# Patient Record
Sex: Female | Born: 1970 | Race: White | Hispanic: No | Marital: Married | Smoking: Never smoker
Health system: Southern US, Community
[De-identification: ages and names within clinical notes are randomized; demographics above are authoritative.]

## PROBLEM LIST (undated history)

## (undated) DIAGNOSIS — I2510 Atherosclerotic heart disease of native coronary artery without angina pectoris: Secondary | ICD-10-CM

## (undated) DIAGNOSIS — Z86718 Personal history of other venous thrombosis and embolism: Secondary | ICD-10-CM

## (undated) DIAGNOSIS — I10 Essential (primary) hypertension: Secondary | ICD-10-CM

## (undated) DIAGNOSIS — E119 Type 2 diabetes mellitus without complications: Secondary | ICD-10-CM

## (undated) HISTORY — DX: Atherosclerotic heart disease of native coronary artery without angina pectoris: I25.10

## (undated) HISTORY — DX: Personal history of other venous thrombosis and embolism: Z86.718

## (undated) HISTORY — DX: Essential (primary) hypertension: I10

## (undated) HISTORY — PX: CHOLECYSTECTOMY: SHX55

## (undated) HISTORY — DX: Type 2 diabetes mellitus without complications: E11.9

## (undated) HISTORY — PX: UMBILICAL HERNIA REPAIR: SHX196

---

## 2006-11-22 ENCOUNTER — Ambulatory Visit (HOSPITAL_COMMUNITY): Admission: RE | Admit: 2006-11-22 | Discharge: 2006-11-22 | Payer: Self-pay | Admitting: Obstetrics and Gynecology

## 2006-11-29 ENCOUNTER — Encounter: Admission: RE | Admit: 2006-11-29 | Discharge: 2006-11-29 | Payer: Self-pay | Admitting: Obstetrics and Gynecology

## 2006-12-17 ENCOUNTER — Ambulatory Visit (HOSPITAL_COMMUNITY): Admission: RE | Admit: 2006-12-17 | Discharge: 2006-12-17 | Payer: Self-pay | Admitting: Obstetrics and Gynecology

## 2007-01-14 ENCOUNTER — Ambulatory Visit (HOSPITAL_COMMUNITY): Admission: RE | Admit: 2007-01-14 | Discharge: 2007-01-14 | Payer: Self-pay | Admitting: Obstetrics and Gynecology

## 2007-01-28 ENCOUNTER — Ambulatory Visit (HOSPITAL_COMMUNITY): Admission: RE | Admit: 2007-01-28 | Discharge: 2007-01-28 | Payer: Self-pay | Admitting: Obstetrics and Gynecology

## 2007-02-23 ENCOUNTER — Ambulatory Visit (HOSPITAL_COMMUNITY): Admission: RE | Admit: 2007-02-23 | Discharge: 2007-02-23 | Payer: Self-pay | Admitting: Obstetrics and Gynecology

## 2007-03-18 ENCOUNTER — Ambulatory Visit (HOSPITAL_COMMUNITY): Admission: RE | Admit: 2007-03-18 | Discharge: 2007-03-18 | Payer: Self-pay | Admitting: Obstetrics and Gynecology

## 2007-04-08 ENCOUNTER — Ambulatory Visit (HOSPITAL_COMMUNITY): Admission: RE | Admit: 2007-04-08 | Discharge: 2007-04-08 | Payer: Self-pay | Admitting: Obstetrics and Gynecology

## 2007-04-29 ENCOUNTER — Ambulatory Visit (HOSPITAL_COMMUNITY): Admission: RE | Admit: 2007-04-29 | Discharge: 2007-04-29 | Payer: Self-pay | Admitting: Obstetrics and Gynecology

## 2007-05-23 ENCOUNTER — Ambulatory Visit (HOSPITAL_COMMUNITY): Admission: RE | Admit: 2007-05-23 | Discharge: 2007-05-23 | Payer: Self-pay | Admitting: Obstetrics and Gynecology

## 2009-05-19 IMAGING — US US OB NUCHAL TRANSLUCENCY 1ST GEST
1 series · 14 of 28 positions shown · non-contrast
Comparison: none

OBSTETRICAL ULTRASOUND:
 This ultrasound was performed in The [HOSPITAL], and the [facility] report will be stored to [REDACTED] PACS.

[Series 1: us ob nuchal translucency 1st gest · 14 of 34 slices shown]
[im 2/34]
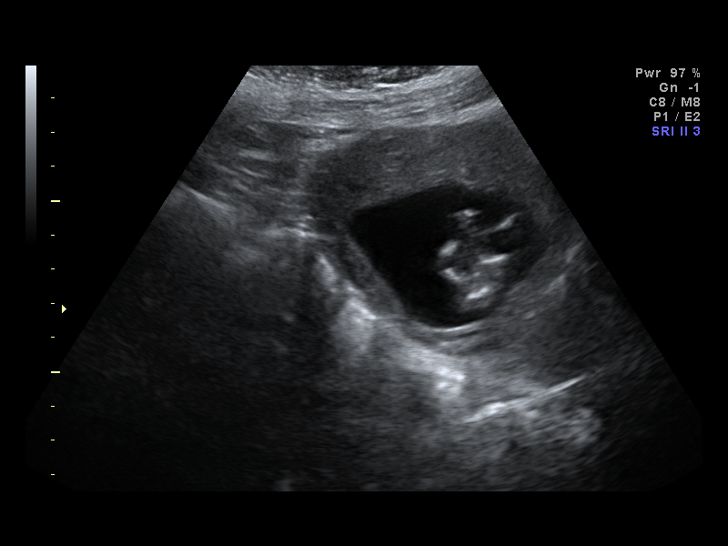
[im 4/34]
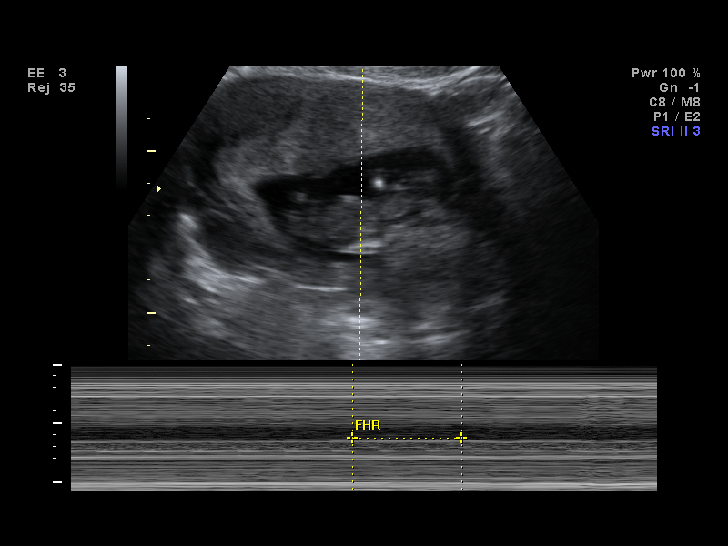
[im 7/34]
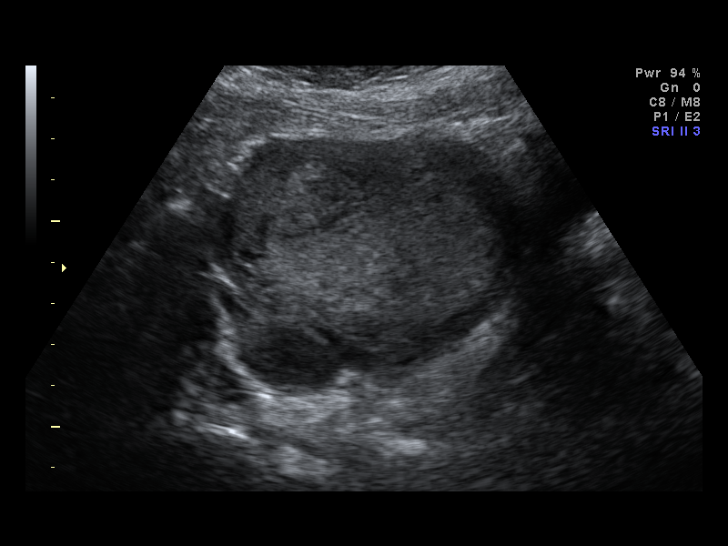
[im 9/34]
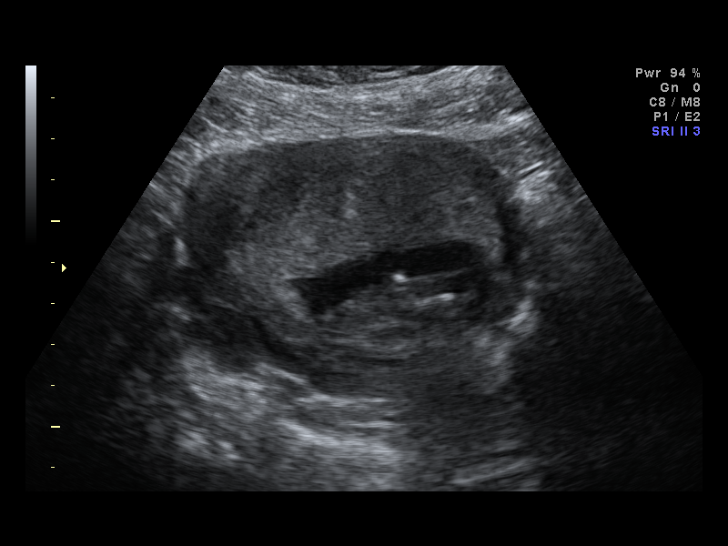
[im 12/34]
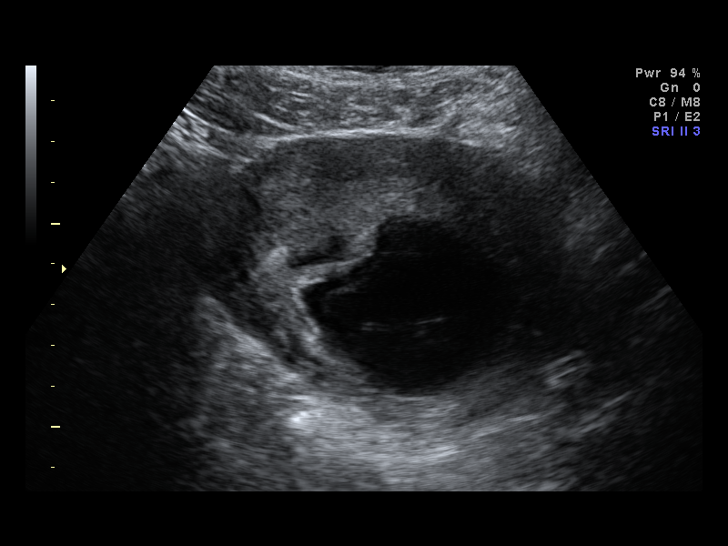
[im 14/34]
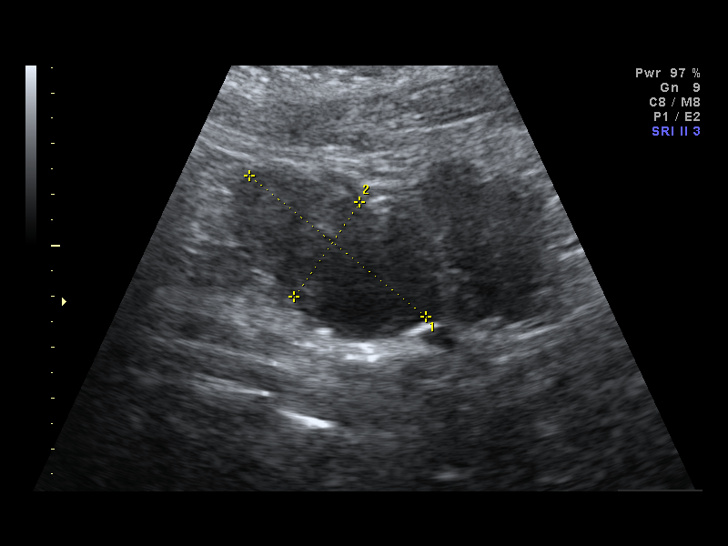
[im 16/34]
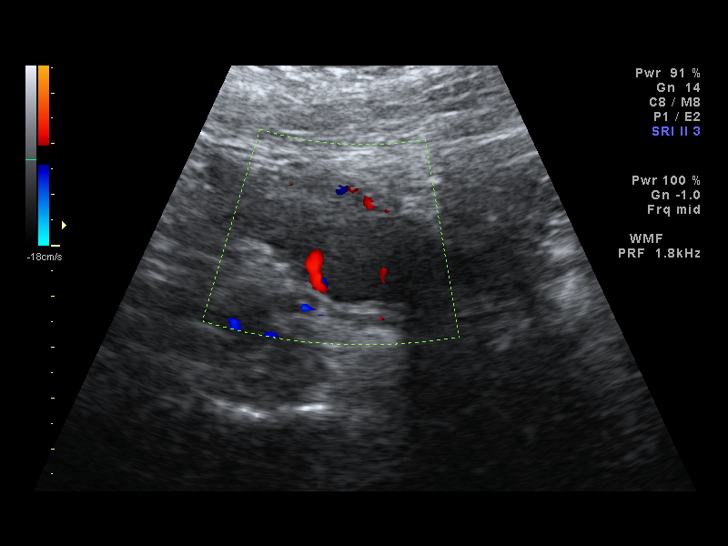
[im 19/34]
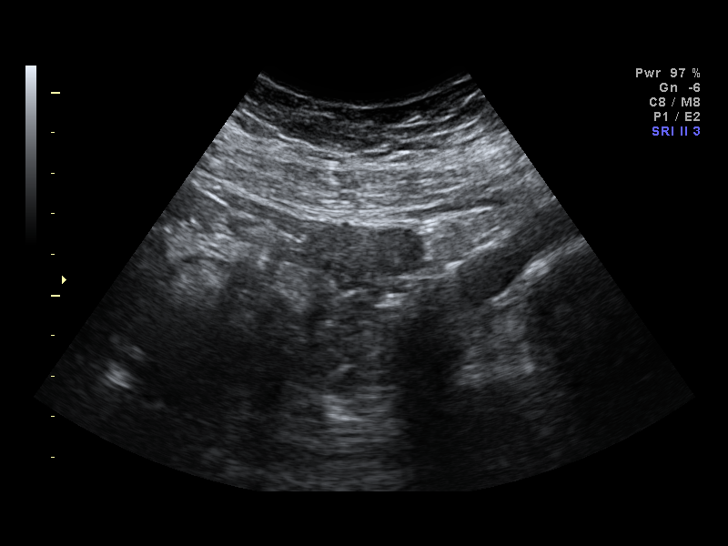
[im 21/34]
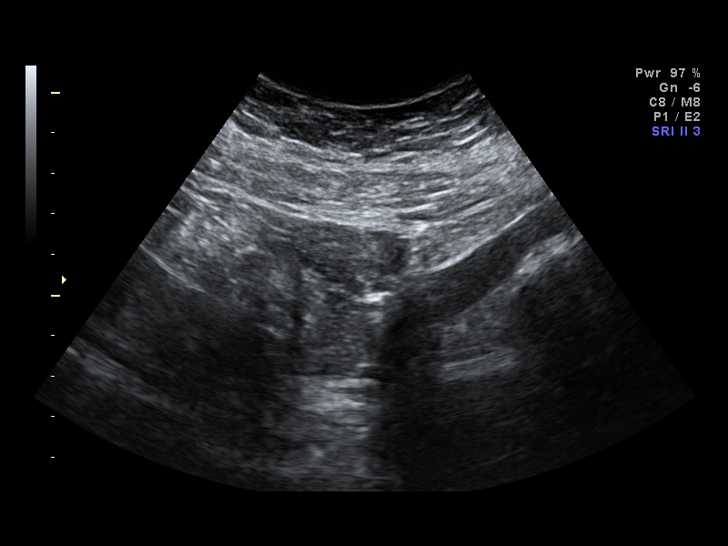
[im 24/34]
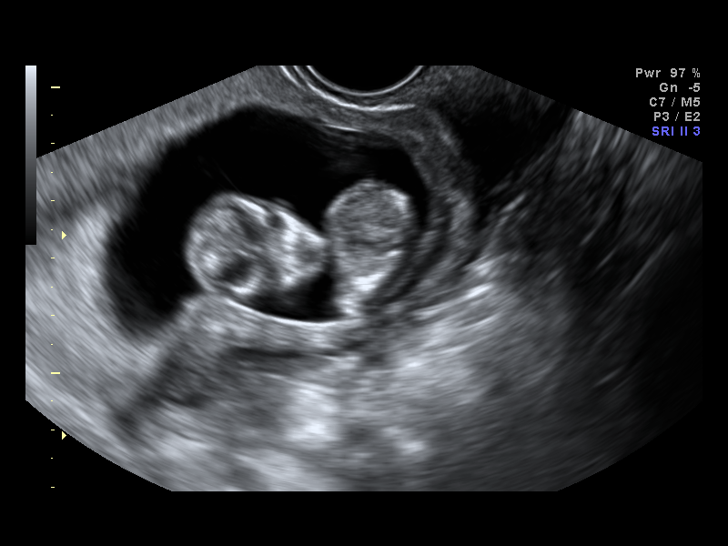
[im 26/34]
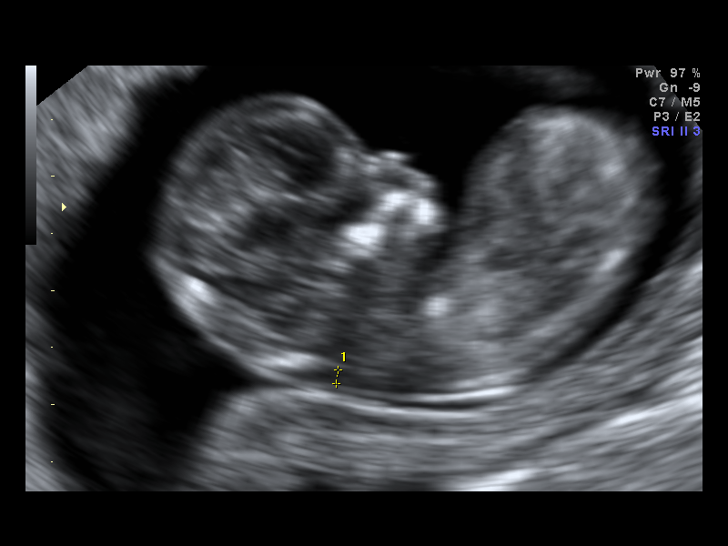
[im 29/34]
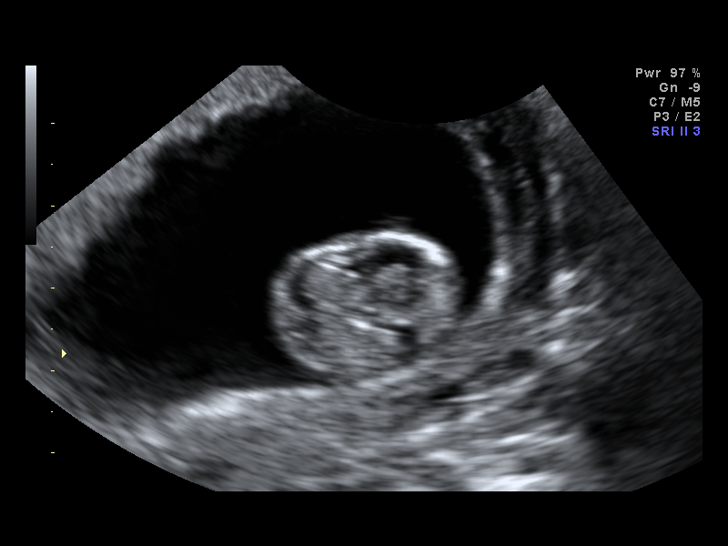
[im 31/34]
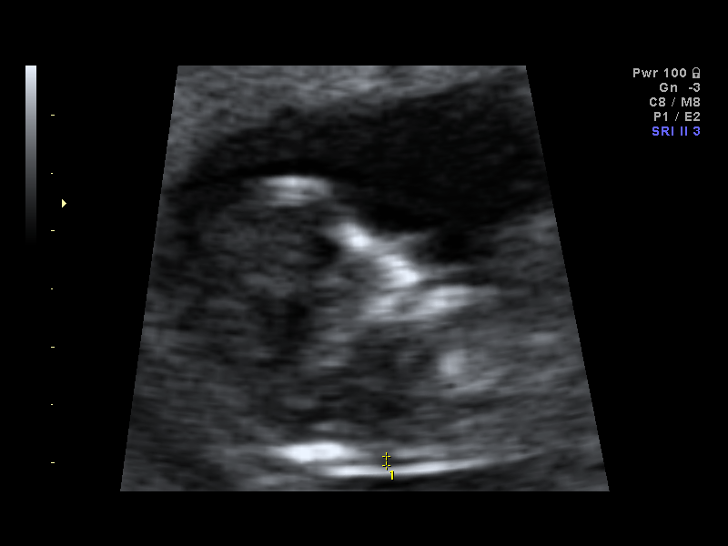
[im 34/34]
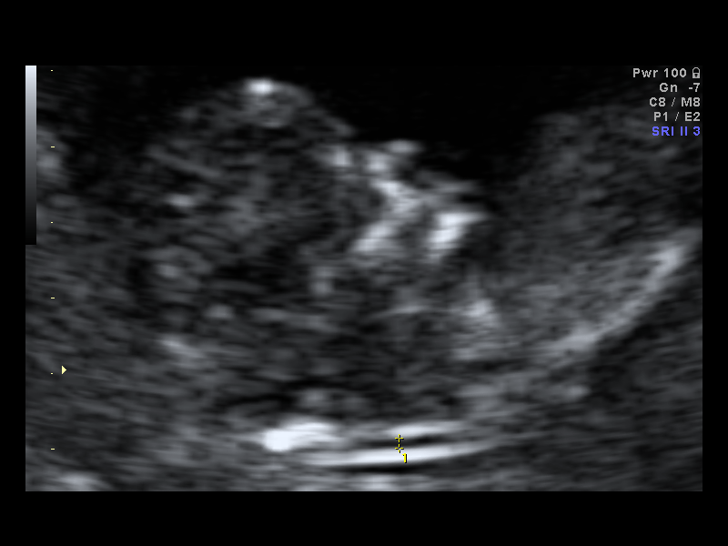

[14 of 28 positions shown; findings below may reference images not displayed]

IMPRESSION: The [facility] report has also been faxed to the ordering physician.

## 2009-05-30 ENCOUNTER — Ambulatory Visit (HOSPITAL_COMMUNITY): Admission: RE | Admit: 2009-05-30 | Discharge: 2009-05-30 | Payer: Self-pay | Admitting: Obstetrics and Gynecology

## 2009-06-27 ENCOUNTER — Ambulatory Visit (HOSPITAL_COMMUNITY): Admission: RE | Admit: 2009-06-27 | Discharge: 2009-06-27 | Payer: Self-pay | Admitting: Obstetrics and Gynecology

## 2009-07-10 ENCOUNTER — Ambulatory Visit (HOSPITAL_COMMUNITY): Admission: RE | Admit: 2009-07-10 | Discharge: 2009-07-10 | Payer: Self-pay | Admitting: Obstetrics and Gynecology

## 2009-08-18 IMAGING — US US OB FOLLOW-UP
1 series · 14 of 28 positions shown · non-contrast
Comparison: none

OBSTETRICAL ULTRASOUND:
 This ultrasound was performed in The [HOSPITAL], and the [facility] report will be stored to [REDACTED] PACS.

[Series 1: us ob follow-up · 14 of 34 slices shown]
[im 2/34]
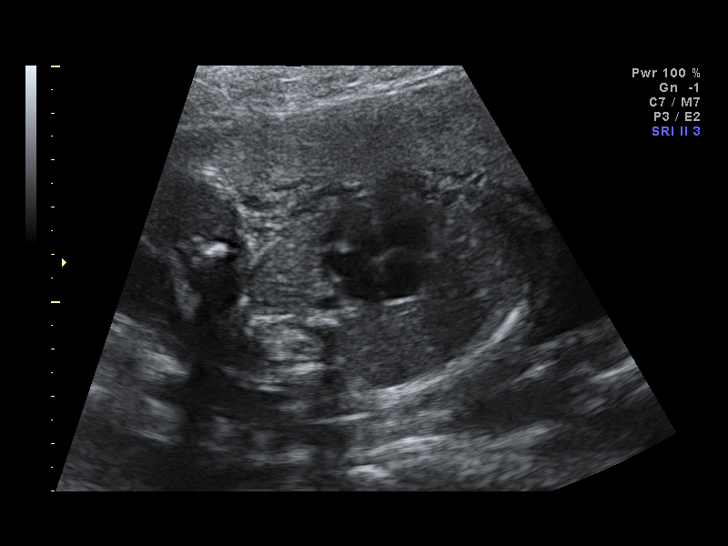
[im 4/34]
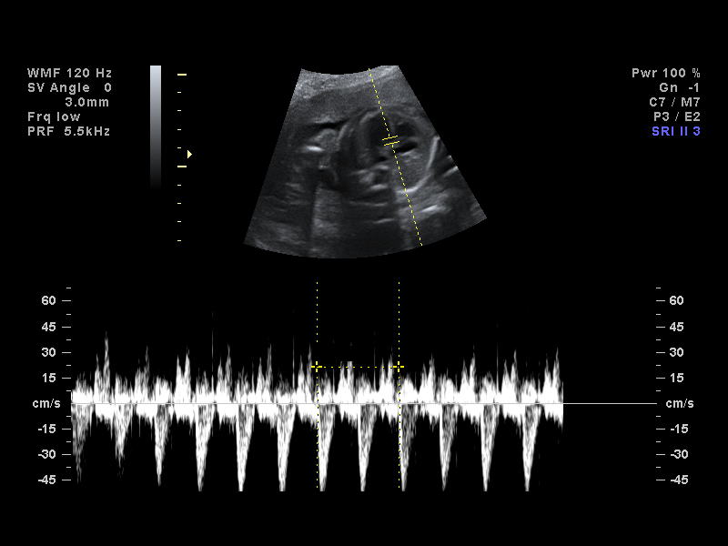
[im 7/34]
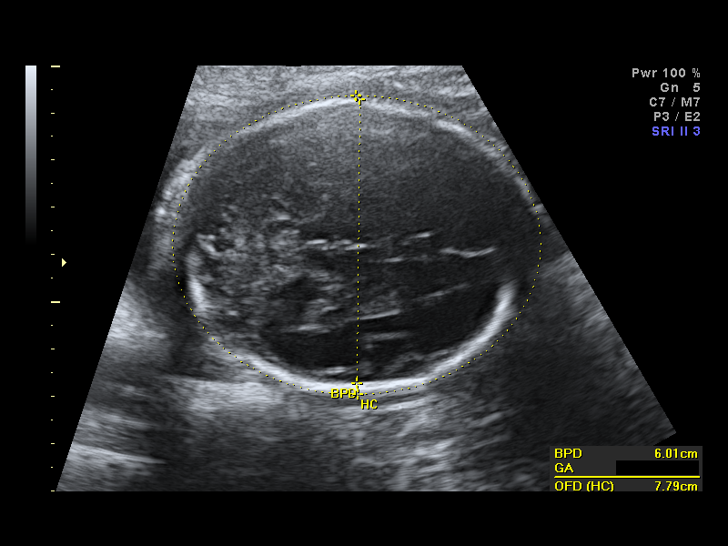
[im 9/34]
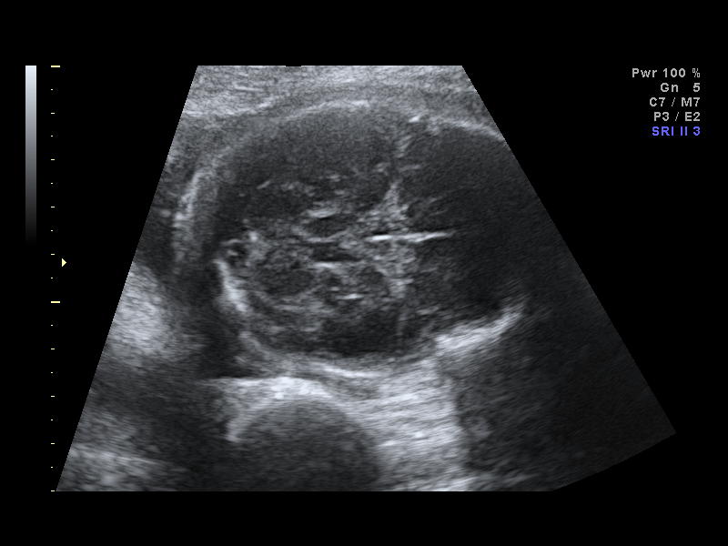
[im 12/34]
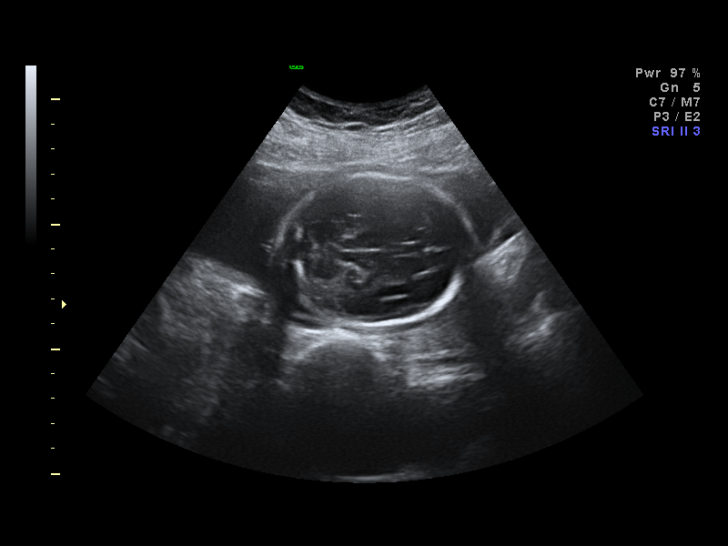
[im 14/34]
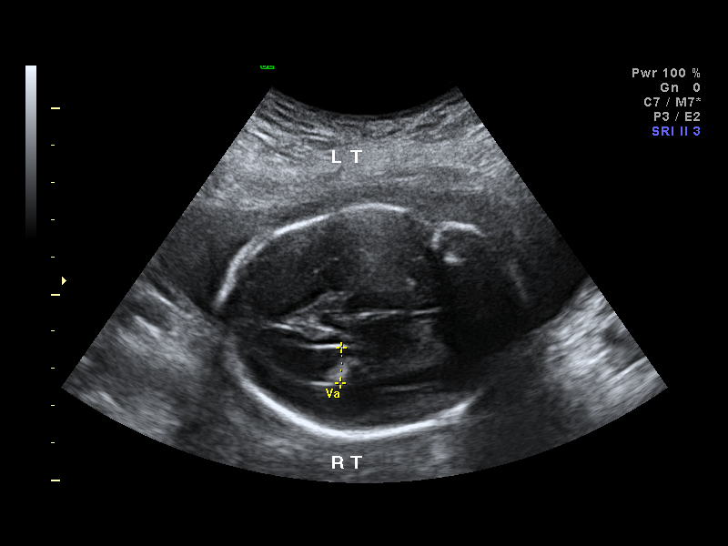
[im 16/34]
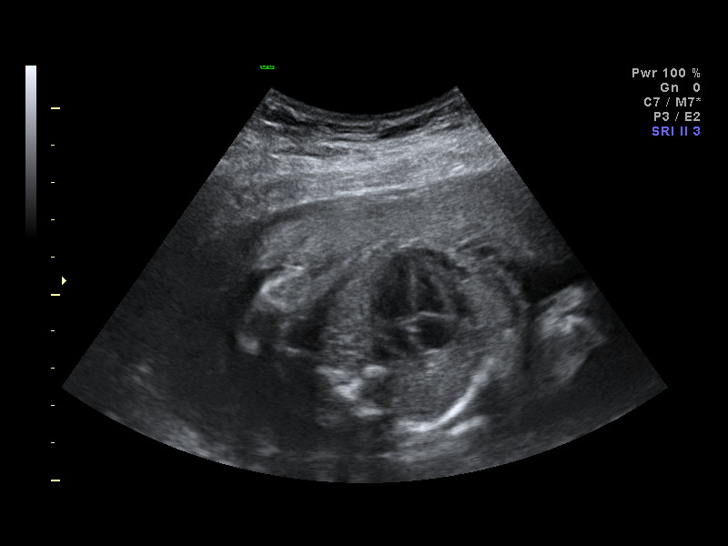
[im 19/34]
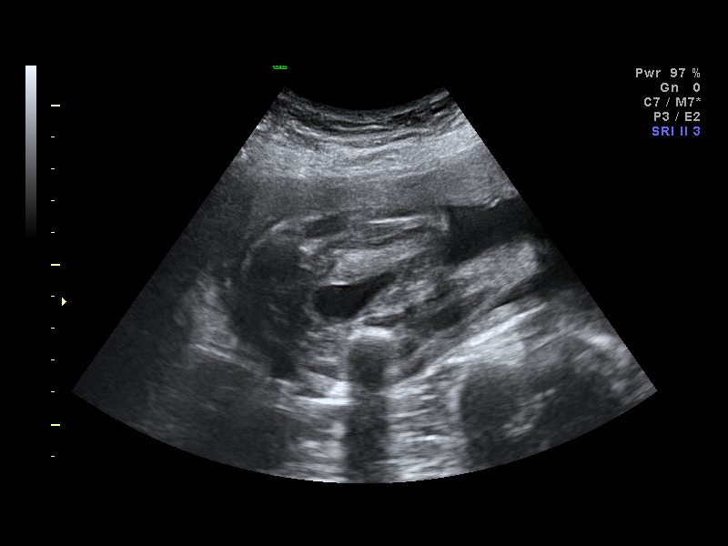
[im 21/34]
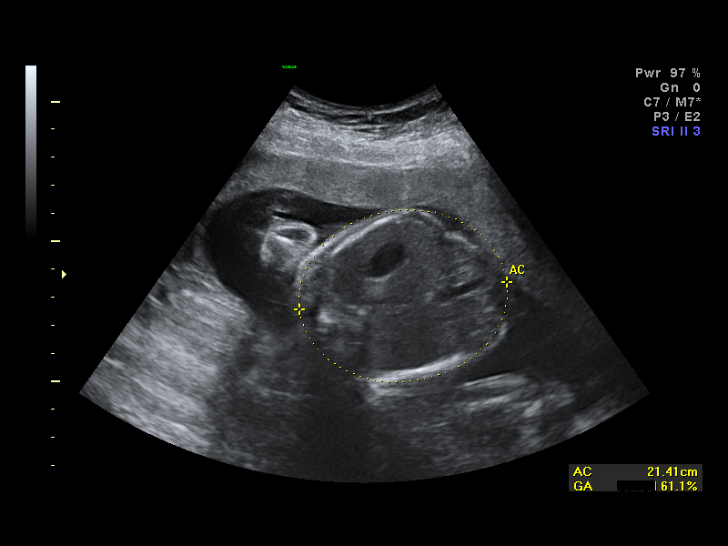
[im 24/34]
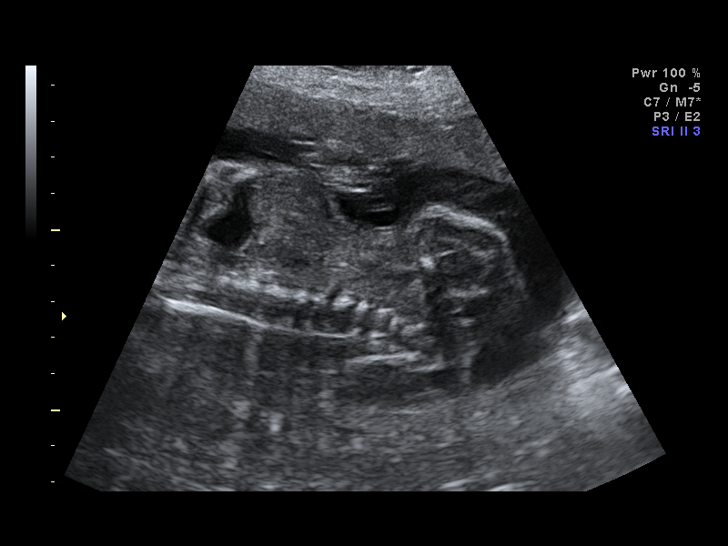
[im 26/34]
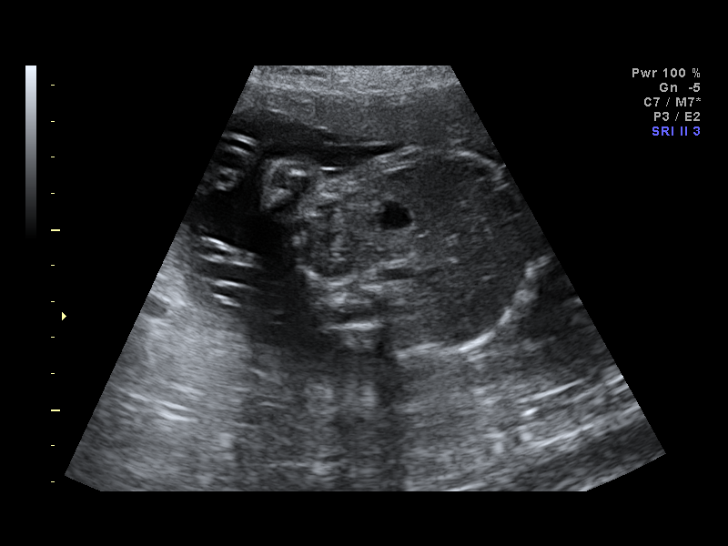
[im 29/34]
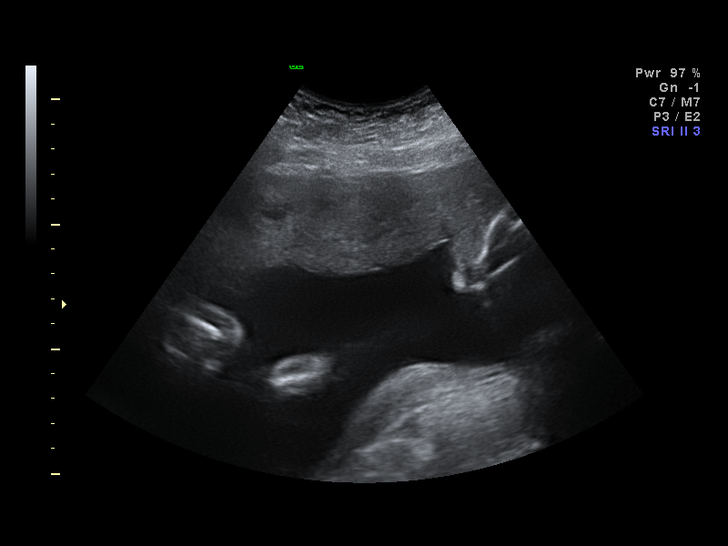
[im 31/34]
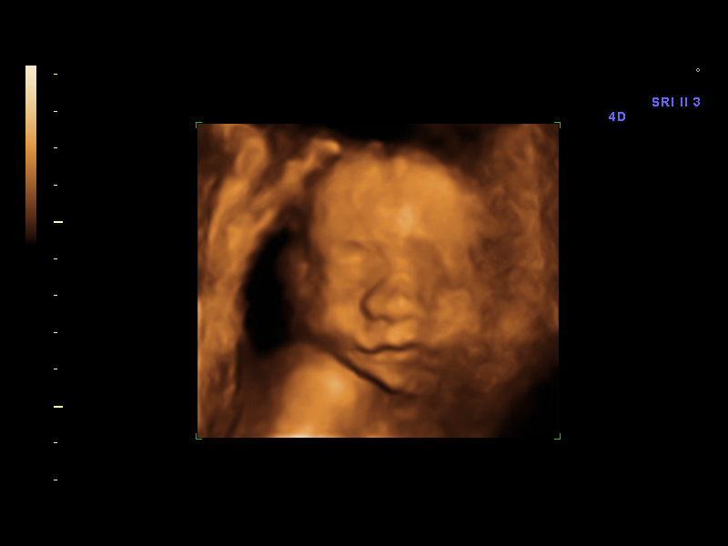
[im 34/34]
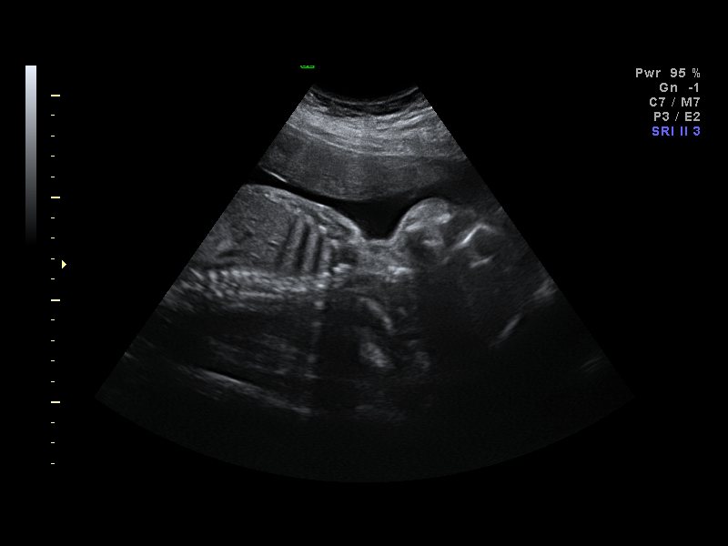

[14 of 28 positions shown; findings below may reference images not displayed]

IMPRESSION: The [facility] report has also been faxed to the ordering physician.

## 2010-06-15 ENCOUNTER — Encounter: Payer: Self-pay | Admitting: Obstetrics and Gynecology

## 2011-10-31 IMAGING — US US OB NUCHAL TRANSLUCENCY 1ST GEST
1 series · 14 of 28 positions shown · non-contrast
Comparison: none

OBSTETRICAL ULTRASOUND:
 This ultrasound was performed in The [[facility]OSP[facility]], and the [facility] report will be stored to [REDACTED] PACS.  This report is also available in [HOSPITAL]?s accessANYware.

[Series 1: us ob nuchal translucency 1st gest · 14 of 34 slices shown]
[im 2/34]
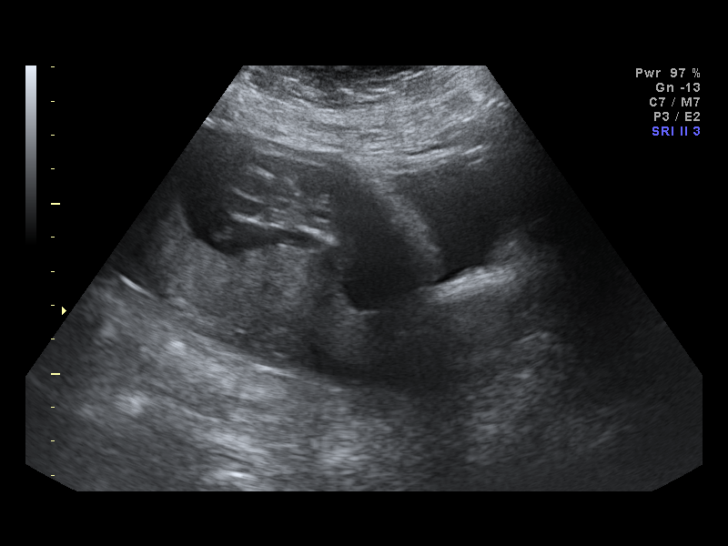
[im 4/34]
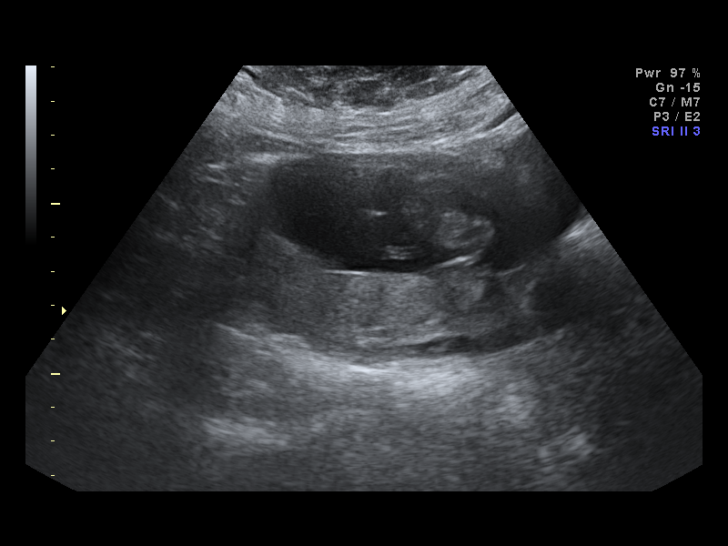
[im 7/34]
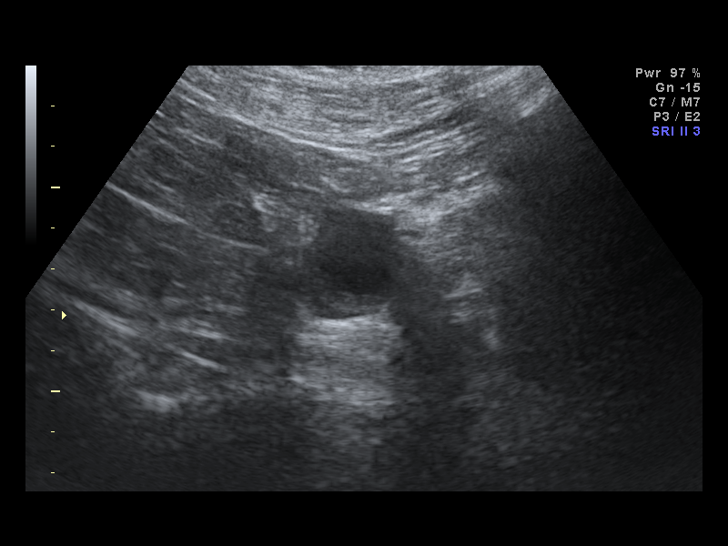
[im 9/34]
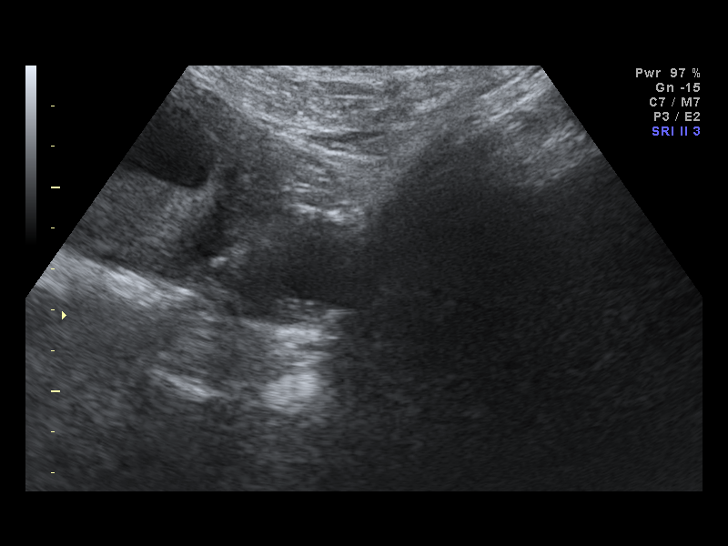
[im 12/34]
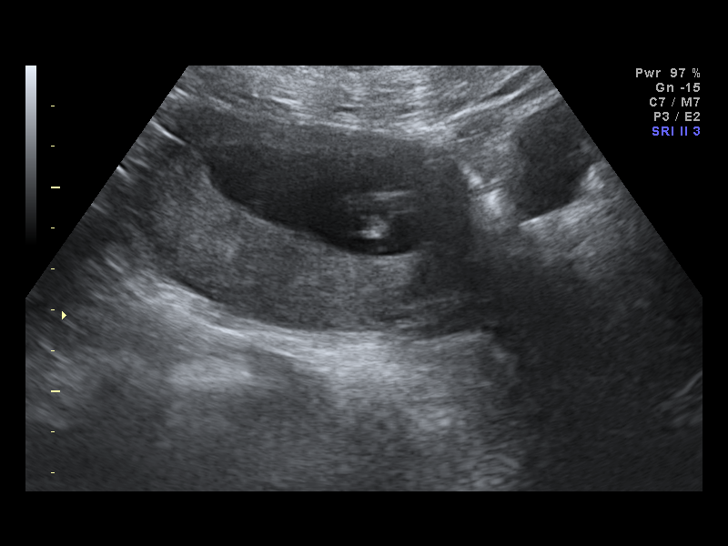
[im 14/34]
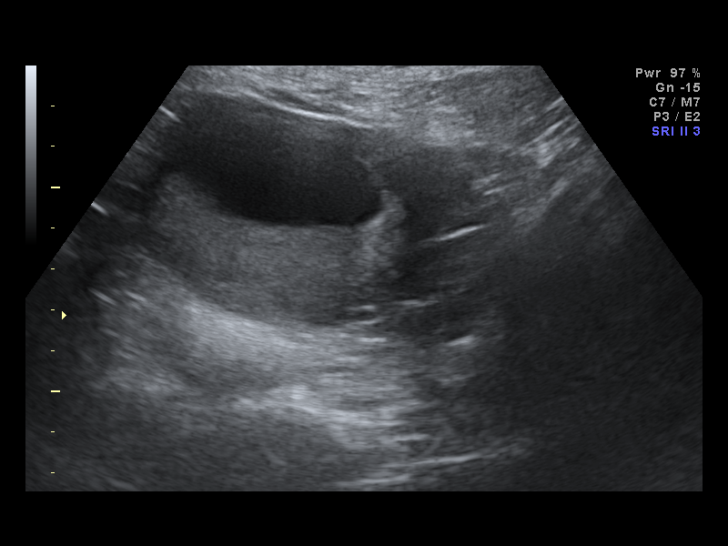
[im 16/34]
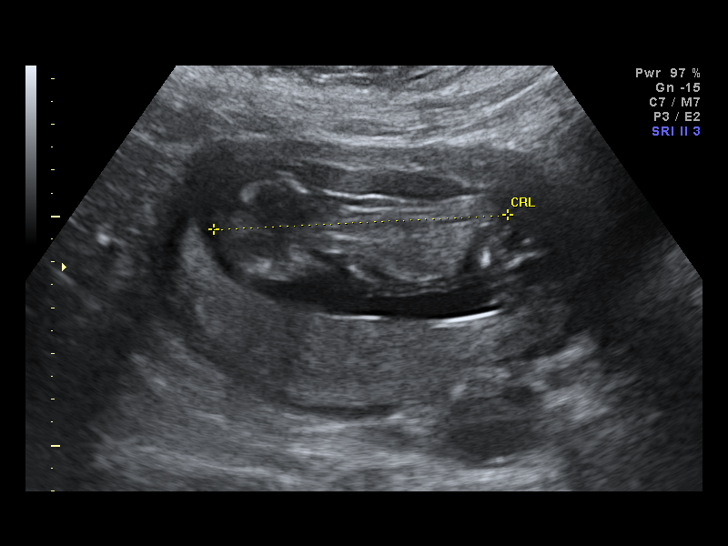
[im 19/34]
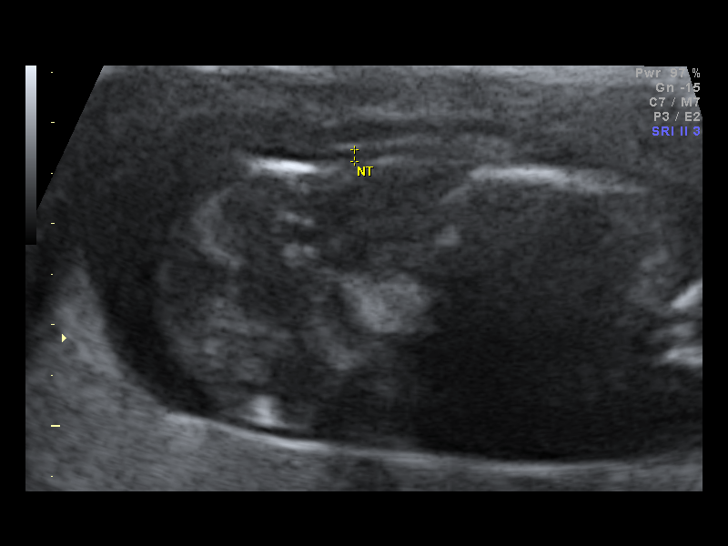
[im 21/34]
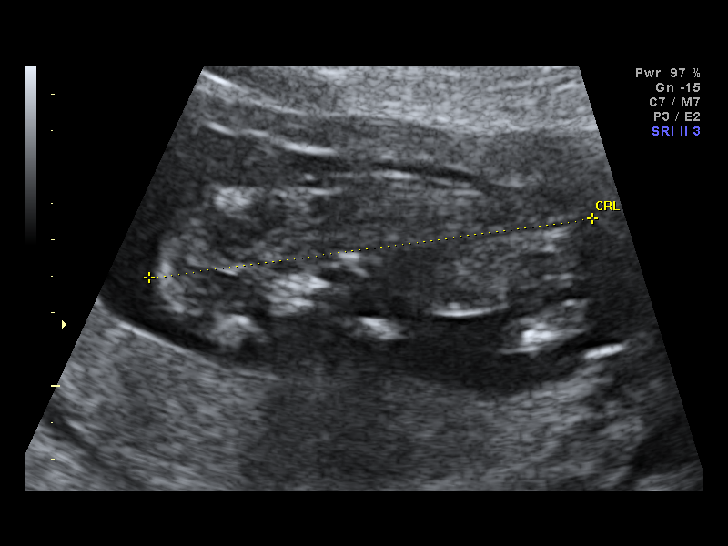
[im 24/34]
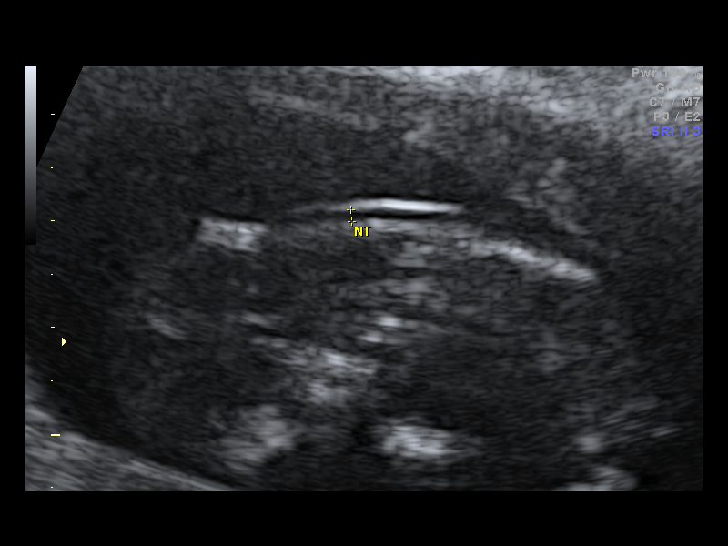
[im 26/34]
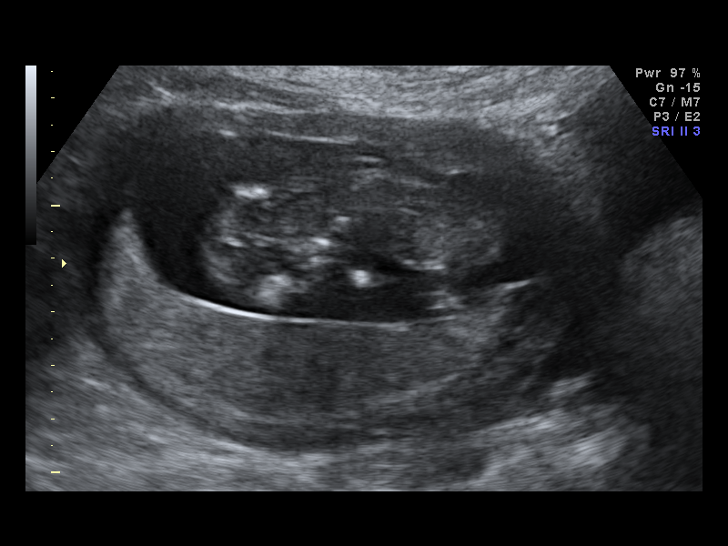
[im 29/34]
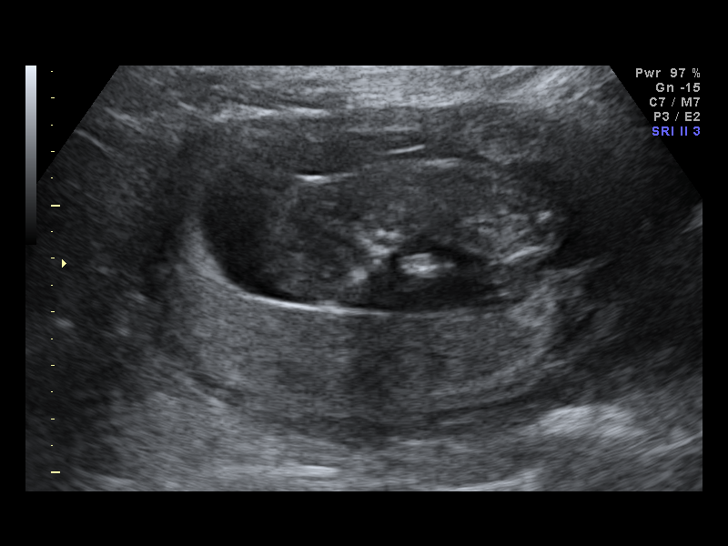
[im 31/34]
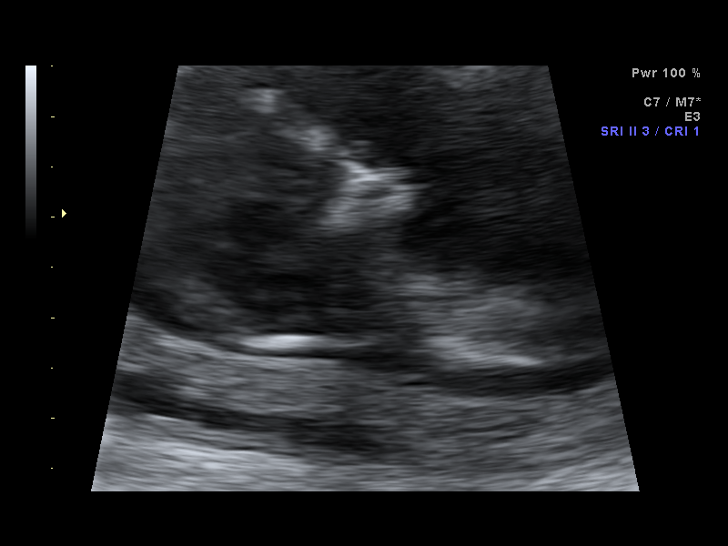
[im 34/34]
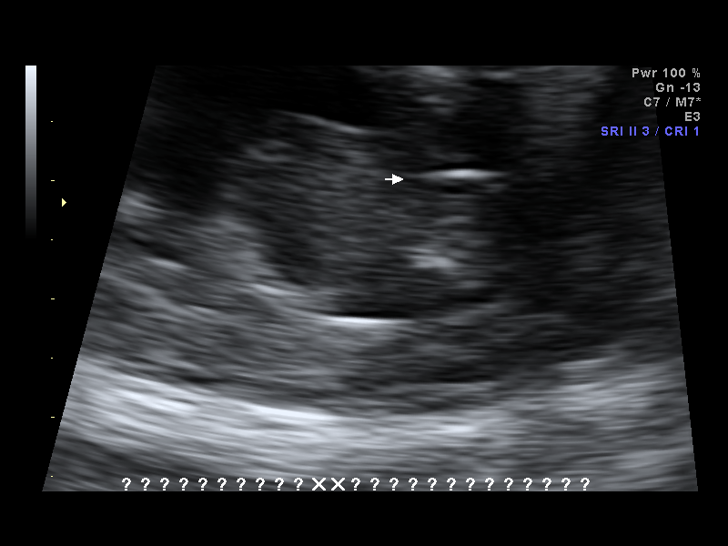

[14 of 28 positions shown; findings below may reference images not displayed]

IMPRESSION: [facility] has also been faxed to the ordering physician.

## 2015-10-31 DIAGNOSIS — Z79899 Other long term (current) drug therapy: Secondary | ICD-10-CM

## 2015-10-31 DIAGNOSIS — Z794 Long term (current) use of insulin: Secondary | ICD-10-CM

## 2015-10-31 DIAGNOSIS — R5383 Other fatigue: Secondary | ICD-10-CM | POA: Insufficient documentation

## 2015-10-31 DIAGNOSIS — Z6826 Body mass index (BMI) 26.0-26.9, adult: Secondary | ICD-10-CM

## 2015-10-31 DIAGNOSIS — R5381 Other malaise: Secondary | ICD-10-CM

## 2015-10-31 DIAGNOSIS — Z6824 Body mass index (BMI) 24.0-24.9, adult: Secondary | ICD-10-CM | POA: Insufficient documentation

## 2015-10-31 DIAGNOSIS — N1831 Chronic kidney disease, stage 3a: Secondary | ICD-10-CM

## 2015-10-31 DIAGNOSIS — E669 Obesity, unspecified: Secondary | ICD-10-CM

## 2015-10-31 DIAGNOSIS — E782 Mixed hyperlipidemia: Secondary | ICD-10-CM

## 2015-10-31 HISTORY — DX: Long term (current) use of insulin: Z79.4

## 2015-10-31 HISTORY — DX: Body mass index (BMI) 26.0-26.9, adult: Z68.26

## 2015-10-31 HISTORY — DX: Chronic kidney disease, stage 3a: N18.31

## 2015-10-31 HISTORY — DX: Other malaise: R53.81

## 2015-10-31 HISTORY — DX: Other long term (current) drug therapy: Z79.899

## 2015-10-31 HISTORY — DX: Obesity, unspecified: E66.9

## 2015-10-31 HISTORY — DX: Mixed hyperlipidemia: E78.2

## 2016-05-15 DIAGNOSIS — K5790 Diverticulosis of intestine, part unspecified, without perforation or abscess without bleeding: Secondary | ICD-10-CM

## 2016-05-15 HISTORY — DX: Diverticulosis of intestine, part unspecified, without perforation or abscess without bleeding: K57.90

## 2016-05-26 DIAGNOSIS — Z86718 Personal history of other venous thrombosis and embolism: Secondary | ICD-10-CM

## 2016-05-26 HISTORY — DX: Personal history of other venous thrombosis and embolism: Z86.718

## 2016-08-25 ENCOUNTER — Encounter (INDEPENDENT_AMBULATORY_CARE_PROVIDER_SITE_OTHER): Payer: Self-pay | Admitting: Orthopedic Surgery

## 2016-08-25 ENCOUNTER — Ambulatory Visit (INDEPENDENT_AMBULATORY_CARE_PROVIDER_SITE_OTHER): Payer: BC Managed Care – PPO | Admitting: Orthopedic Surgery

## 2016-08-25 VITALS — Ht 68.0 in | Wt 218.0 lb

## 2016-08-25 DIAGNOSIS — M76821 Posterior tibial tendinitis, right leg: Secondary | ICD-10-CM

## 2016-08-25 HISTORY — DX: Posterior tibial tendinitis, right leg: M76.821

## 2016-08-25 NOTE — Progress Notes (Signed)
   Office Visit Note   Patient: Crystal Wu           Date of Birth: 1971-05-02           MRN: [id number] Visit Date: 08/25/2016              Requested by: No referring provider defined for this encounter. PCP: Feliciana Rossetti, MD  Chief Complaint  Patient presents with  . 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 Patient has been in the emergency room at [facility] and she was diagnosed with a DVT in the right calf. She is been on Xarelto for this. Patient also had a radiograph obtained of her foot and ankle and she was told that her foot pain was due to her heel spur. Patient states she has had symptoms for 8 months.  Assessment & Plan: Visit Diagnoses:  1. Posterior tibial tendinitis, right leg     Plan: We will place the posterior tibial tendon brace. Recommended new balance sneakers with a over-the-counter arch support. Patient cannot take anti-inflammatories due to her treatment for the DVT with Xarelto.  Follow-Up Instructions: Return in about 4 weeks (around 09/22/2016).   Ortho Exam  Patient is alert, oriented, no adenopathy, well-dressed, normal affect, normal respiratory effort. Patient has an antalgic gait. She does have slight pronation and valgus of the forefoot with loss of her arch with standing. She has a good dorsalis pedis pulse she has good ankle and subtalar motion. Patient cannot do a single limb heel raise and attempted single limb heel raise reproduces pain along the posterior tibial tendon. She is tender to palpation along the posterior tibial tendon the plantar fascia and Achilles tendon are nontender to palpation.  Imaging: No results found.  Labs: No results found for: HGBA1C, ESRSEDRATE, CRP, LABURIC, REPTSTATUS, GRAMSTAIN, CULT, LABORGA  Orders:  No orders of the defined  types were placed in this encounter.  No orders of the defined types were placed in this encounter.    Procedures: No procedures performed  Clinical Data: No additional findings.  ROS:  All other systems negative, except as noted in the HPI. Review of Systems  Objective: Vital Signs: Ht  (1.727 m)   Wt 218 lb (98.9 kg)   BMI 33.15 kg/m   Specialty Comments:  No specialty comments available.  PMFS History: Patient Active Problem List   Diagnosis Date Noted  . Posterior tibial tendinitis, right leg 08/25/2016   Past Medical History:  Diagnosis Date  . Diabetes (HCC)   . H/O blood clots    right posterior calf  . Hypertension     No family history on file.  History reviewed. No pertinent surgical history. Social History   Occupational History  . Not on file.   Social History Main Topics  . Smoking status: Never Smoker  . Smokeless tobacco: Never Used  . Alcohol use Not on file  . Drug use: Unknown  . Sexual activity: Not on file

## 2016-09-22 ENCOUNTER — Encounter (INDEPENDENT_AMBULATORY_CARE_PROVIDER_SITE_OTHER): Payer: Self-pay | Admitting: Orthopedic Surgery

## 2016-09-22 ENCOUNTER — Ambulatory Visit (INDEPENDENT_AMBULATORY_CARE_PROVIDER_SITE_OTHER): Payer: BC Managed Care – PPO | Admitting: Orthopedic Surgery

## 2016-09-22 VITALS — Ht 68.0 in | Wt 218.0 lb

## 2016-09-22 DIAGNOSIS — M76821 Posterior tibial tendinitis, right leg: Secondary | ICD-10-CM

## 2016-09-22 DIAGNOSIS — M722 Plantar fascial fibromatosis: Secondary | ICD-10-CM | POA: Diagnosis not present

## 2016-09-22 MED ORDER — NITROGLYCERIN 0.2 MG/HR TD PT24: 0.2000 mg | MEDICATED_PATCH | Freq: Every day | TRANSDERMAL | 12 refills | Status: DC

## 2016-09-23 NOTE — Progress Notes (Signed)
Office Visit Note   Patient: Crystal Wu           Date of Birth: 09/16/1970           MRN: [id number] Visit Date: 09/22/2016              Requested by: Gordan Payment, MD [address] [place], [place] [address] PCP: Feliciana Rossetti, MD  Chief Complaint  Patient presents with  . Right Ankle - Follow-up      HPI: Patient is a 46 year old woman who Presents today in follow-up for posterior tibial tendonitis on the right. She has been in the posterior tibial tendon brace for the last 4 weeks states this has not been helpful. Did also obtain new balance walking shoes has bought some gel orthotic inserts. Does not see much improvement with either of these items either. Now complaining of some plantar heel pain.   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 Feels that this is interfering with her ability to work. 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  She has completed her course is around so and is no longer taking this. States now she would be able to take anti-inflammatories i.e. ibuprofen or Aleve as needed.  Patient states she has had symptoms for 9 months now.  Assessment & Plan: Visit Diagnoses:  1. Posterior tibial tendinitis, right leg     Plan: She may discontinue the posterior tibial tendon brace. Did provide a metatarsal arch support pads for her shoe wear today. She will wear this daily with her new balance sneakers with a over-the-counter arch support. Recommended that she begin taking naproxen twice daily. Did provide a prescription for notch blistering patch which she will place over the insertion of the posterior tibial tendon daily.  Follow-Up Instructions: No Follow-up on file.   Ortho Exam  Patient is alert, oriented, no adenopathy, well-dressed, normal affect, normal respiratory  effort. Patient has an antalgic gait. She does have slight pronation and valgus of the forefoot with loss of her arch with standing. She has a good dorsalis pedis pulse she has good ankle and subtalar motion. Attempted single limb heel raise reproduces pain along the posterior tibial tendon. She is tender to palpation along the posterior tibial tendon. the plantar fascia origin is minimally tender to deep palpation. There is no pain with the lateral compression of the calcaneus. Achilles tendon is nontender to palpation.  Imaging: No results found.  Labs: No results found for: HGBA1C, ESRSEDRATE, CRP, LABURIC, REPTSTATUS, GRAMSTAIN, CULT, LABORGA  Orders:  No orders of the defined types were placed in this encounter.  Meds ordered this encounter  Medications  . nitroGLYCERIN (NITRODUR - DOSED IN MG/24 HR) 0.2 mg/hr patch    Sig: Place 1 patch (0.2 mg total) onto the skin daily.    Dispense:  30 patch    Refill:  12     Procedures: No procedures performed  Clinical Data: No additional findings.  ROS:  All other systems negative, except as noted in the HPI. Review of Systems  Constitutional: Negative for chills and fever.  Musculoskeletal: Positive for gait problem and myalgias.    Objective: Vital Signs: Ht  (1.727 m)   Wt 218 lb (98.9 kg)   BMI 33.15 kg/m   Specialty  Comments:  No specialty comments available.  PMFS History: Patient Active Problem List   Diagnosis Date Noted  . Posterior tibial tendinitis, right leg 08/25/2016   Past Medical History:  Diagnosis Date  . Diabetes (HCC)   . H/O blood clots    right posterior calf  . Hypertension     No family history on file.  No past surgical history on file. Social History   Occupational History  . Not on file.   Social History Main Topics  . Smoking status: Never Smoker  . Smokeless tobacco: Never Used  . Alcohol use Not on file  . Drug use: Unknown  . Sexual activity: Not on file

## 2016-10-20 ENCOUNTER — Ambulatory Visit (INDEPENDENT_AMBULATORY_CARE_PROVIDER_SITE_OTHER): Payer: BC Managed Care – PPO | Admitting: Orthopedic Surgery

## 2017-05-06 DIAGNOSIS — R011 Cardiac murmur, unspecified: Secondary | ICD-10-CM

## 2017-05-06 HISTORY — DX: Cardiac murmur, unspecified: R01.1

## 2017-07-19 DIAGNOSIS — I10 Essential (primary) hypertension: Secondary | ICD-10-CM | POA: Insufficient documentation

## 2017-07-19 HISTORY — DX: Essential (primary) hypertension: I10

## 2018-01-12 ENCOUNTER — Ambulatory Visit (INDEPENDENT_AMBULATORY_CARE_PROVIDER_SITE_OTHER): Payer: Self-pay

## 2018-01-12 ENCOUNTER — Encounter (INDEPENDENT_AMBULATORY_CARE_PROVIDER_SITE_OTHER): Payer: Self-pay | Admitting: Surgery

## 2018-01-12 ENCOUNTER — Ambulatory Visit (INDEPENDENT_AMBULATORY_CARE_PROVIDER_SITE_OTHER): Payer: BC Managed Care – PPO | Admitting: Surgery

## 2018-01-12 DIAGNOSIS — M7542 Impingement syndrome of left shoulder: Secondary | ICD-10-CM | POA: Diagnosis not present

## 2018-01-12 DIAGNOSIS — M25512 Pain in left shoulder: Secondary | ICD-10-CM | POA: Diagnosis not present

## 2018-01-12 DIAGNOSIS — G8929 Other chronic pain: Secondary | ICD-10-CM | POA: Diagnosis not present

## 2018-01-12 NOTE — Progress Notes (Signed)
Office Visit Note   Patient: Crystal Wu           Date of Birth: 03/30/1971           MRN: [id number] Visit Date: 01/12/2018              Requested by: Gordan PaymentGrisso, Greg A., MD [address] [place], [place] [id number] PCP: Gordan PaymentGrisso, Greg A., MD   Assessment & Plan: Visit Diagnoses:  1. Chronic left shoulder pain   2. Impingement syndrome of left shoulder     Plan: With patient's impingement symptoms offered conservative treatment with injection.  Patient sent left shoulder prepped Betadine and subacromial Marcaine/Depo-Medrol injection was performed from a posterolateral approach.  Tolerated injection well without convocation.  After sitting for a few minutes patient did report improvement of her pain with Marcaine in place but had obvious stiffness due to the volume of fluid that was injected.  She was given home exercises to do along with a resistance band.  Follow-up with me in a few weeks for recheck.  If she does not have continued improvement I will schedule MRI to rule out rotator cuff tear.  Follow-Up Instructions: Return for with [name] for recheck shoulder.   Orders:  Orders Placed This Encounter  Procedures  . Large Joint Inj: L subacromial bursa  . XR Shoulder Left   No orders of the defined types were placed in this encounter.     Procedures: Large Joint Inj: L subacromial bursa on 01/12/2018 11:58 AM Indications: pain Details: 25 G 1.5 in needle, posterior approach Medications: 4 mL bupivacaine 0.25 %; 3 mL lidocaine 1 %; 40 mg methylPREDNISolone acetate 40 MG/ML Outcome: tolerated well, no immediate complications Consent was given by the patient. Patient was prepped and draped in the usual sterile fashion.       Clinical Data: No additional findings.   Subjective: No chief complaint on file.   HPI 47 year old white female comes in today with complaints of left shoulder pain.  Pain ongoing about 3 months.  No injury.  Pain with overhead activity and  reaching on her back.  No cervical spine or radicular component.  Patient has seen another provider and has had conservative management with exercises,  prednisone taper, activity modification.  Pain does awaken her at night when she lays on her left side.  Patient does have history of insulin-dependent diabetes and prednisone taper did elevate blood sugars. Review of Systems No current cardiac pulmonary GI GU issues  Objective: Vital Signs: There were no vitals taken for this visit.  Physical Exam  Constitutional: She is oriented to person, place, and time. She appears well-developed. No distress.  HENT:  Head: Normocephalic.  Eyes: Pupils are equal, round, and reactive to light. EOM are normal.  Neck: Normal range of motion.  Pulmonary/Chest: No respiratory distress.  Musculoskeletal:  Exam left shoulder she has fairly good range of motion but with discomfort.  Positive impingement test.  Negative drop arm.  Pain and some weakness with supraspinatus resistance.  She is tender over the long head biceps tendon.  No tendon defect.  Some discomfort with biceps resistance.  Neurovascular intact.  Right shoulder unremarkable.  Neurological: She is alert and oriented to person, place, and time.  Skin: Skin is warm and dry.  Psychiatric: She has a normal mood and affect.    Ortho Exam  Specialty Comments:  No specialty comments available.  Imaging: No results found.   PMFS History: Patient Active Problem List  Diagnosis Date Noted  . Posterior tibial tendinitis, right leg 08/25/2016   Past Medical History:  Diagnosis Date  . Diabetes (HCC)   . H/O blood clots    right posterior calf  . Hypertension     History reviewed. No pertinent family history.  History reviewed. No pertinent surgical history. Social History   Occupational History  . Not on file  Tobacco Use  . Smoking status: Never Smoker  . Smokeless tobacco: Never Used  Substance and Sexual Activity  . Alcohol  use: Not on file  . Drug use: Not on file  . Sexual activity: Not on file

## 2018-01-13 ENCOUNTER — Encounter (INDEPENDENT_AMBULATORY_CARE_PROVIDER_SITE_OTHER): Payer: Self-pay | Admitting: Surgery

## 2018-01-13 MED ORDER — METHYLPREDNISOLONE ACETATE 40 MG/ML IJ SUSP
40.0000 mg | INTRAMUSCULAR | Status: AC | PRN
  Administered 2018-01-12: 40 mg via INTRA_ARTICULAR

## 2018-01-13 MED ORDER — BUPIVACAINE HCL 0.25 % IJ SOLN
4.0000 mL | INTRAMUSCULAR | Status: AC | PRN
  Administered 2018-01-12: 4 mL via INTRA_ARTICULAR

## 2018-01-13 MED ORDER — LIDOCAINE HCL 1 % IJ SOLN
3.0000 mL | INTRAMUSCULAR | Status: AC | PRN
  Administered 2018-01-12: 3 mL

## 2018-05-03 ENCOUNTER — Encounter (INDEPENDENT_AMBULATORY_CARE_PROVIDER_SITE_OTHER): Payer: BC Managed Care – PPO

## 2018-05-03 ENCOUNTER — Other Ambulatory Visit: Payer: Self-pay | Admitting: Internal Medicine

## 2018-05-03 DIAGNOSIS — R601 Generalized edema: Secondary | ICD-10-CM | POA: Diagnosis not present

## 2018-05-03 NOTE — Progress Notes (Signed)
Bilateral lower extremity Venous duplex exam has been performed. No evidence of DVT.  Jimmy Mackenzy Eisenberg RDCS, RVT

## 2018-05-04 ENCOUNTER — Other Ambulatory Visit: Payer: Self-pay | Admitting: Internal Medicine

## 2018-05-04 ENCOUNTER — Other Ambulatory Visit (INDEPENDENT_AMBULATORY_CARE_PROVIDER_SITE_OTHER): Payer: BC Managed Care – PPO

## 2018-05-04 DIAGNOSIS — R601 Generalized edema: Secondary | ICD-10-CM

## 2018-05-04 NOTE — Progress Notes (Signed)
ABI has been performed. Normal exam  Jimmy Aviah Sorci RDCS, RVT 

## 2018-06-06 DIAGNOSIS — E039 Hypothyroidism, unspecified: Secondary | ICD-10-CM

## 2018-06-06 DIAGNOSIS — N183 Chronic kidney disease, stage 3 unspecified: Secondary | ICD-10-CM

## 2018-06-06 DIAGNOSIS — E1322 Other specified diabetes mellitus with diabetic chronic kidney disease: Secondary | ICD-10-CM

## 2018-06-06 DIAGNOSIS — IMO0002 Reserved for concepts with insufficient information to code with codable children: Secondary | ICD-10-CM

## 2018-06-06 HISTORY — DX: Hypothyroidism, unspecified: E03.9

## 2018-06-06 HISTORY — DX: Chronic kidney disease, stage 3 unspecified: N18.30

## 2018-06-06 HISTORY — DX: Reserved for concepts with insufficient information to code with codable children: IMO0002

## 2018-06-06 HISTORY — DX: Other specified diabetes mellitus with hyperglycemia: E13.22

## 2018-07-05 DIAGNOSIS — N390 Urinary tract infection, site not specified: Secondary | ICD-10-CM

## 2018-07-05 HISTORY — DX: Urinary tract infection, site not specified: N39.0

## 2019-07-03 DIAGNOSIS — I2510 Atherosclerotic heart disease of native coronary artery without angina pectoris: Secondary | ICD-10-CM | POA: Insufficient documentation

## 2019-07-03 DIAGNOSIS — Z8616 Personal history of COVID-19: Secondary | ICD-10-CM

## 2019-07-03 HISTORY — DX: Personal history of COVID-19: Z86.16

## 2019-08-02 ENCOUNTER — Ambulatory Visit (INDEPENDENT_AMBULATORY_CARE_PROVIDER_SITE_OTHER): Payer: BC Managed Care – PPO | Admitting: Cardiology

## 2019-08-02 ENCOUNTER — Other Ambulatory Visit: Payer: Self-pay

## 2019-08-02 ENCOUNTER — Encounter (INDEPENDENT_AMBULATORY_CARE_PROVIDER_SITE_OTHER): Payer: Self-pay

## 2019-08-02 ENCOUNTER — Encounter: Payer: Self-pay | Admitting: Cardiology

## 2019-08-02 VITALS — BP 112/86 | HR 72 | Ht 68.0 in | Wt 235.0 lb

## 2019-08-02 DIAGNOSIS — Z794 Long term (current) use of insulin: Secondary | ICD-10-CM

## 2019-08-02 DIAGNOSIS — I10 Essential (primary) hypertension: Secondary | ICD-10-CM | POA: Insufficient documentation

## 2019-08-02 DIAGNOSIS — R079 Chest pain, unspecified: Secondary | ICD-10-CM | POA: Diagnosis not present

## 2019-08-02 DIAGNOSIS — E119 Type 2 diabetes mellitus without complications: Secondary | ICD-10-CM | POA: Insufficient documentation

## 2019-08-02 DIAGNOSIS — I2510 Atherosclerotic heart disease of native coronary artery without angina pectoris: Secondary | ICD-10-CM

## 2019-08-02 DIAGNOSIS — E785 Hyperlipidemia, unspecified: Secondary | ICD-10-CM

## 2019-08-02 HISTORY — DX: Type 2 diabetes mellitus without complications: E11.9

## 2019-08-02 HISTORY — DX: Atherosclerotic heart disease of native coronary artery without angina pectoris: I25.10

## 2019-08-02 HISTORY — DX: Long term (current) use of insulin: Z79.4

## 2019-08-02 HISTORY — DX: Hyperlipidemia, unspecified: E78.5

## 2019-08-02 MED ORDER — ASPIRIN EC 81 MG PO TBEC: 81.0000 mg | DELAYED_RELEASE_TABLET | Freq: Every day | ORAL | 0 refills | Status: AC

## 2019-08-02 NOTE — Patient Instructions (Signed)
Medication Instructions:  Your physician has recommended you make the following change in your medication:   START: Aspirin 81 mg daily   *If you need a refill on your cardiac medications before your next appointment, please call your pharmacy*   Lab Work: None.  If you have labs (blood work) drawn today and your tests are completely normal, you will receive your results only by: Marland Kitchen MyChart Message (if you have MyChart) OR . A paper copy in the mail If you have any lab test that is abnormal or we need to change your treatment, we will call you to review the results.   Testing/Procedures: Your physician has requested that you have a lexiscan myoview. For further information please visit [email or web address] Please follow instruction sheet, as given.     Follow-Up: At [facility], you and your health needs are our priority.  As part of our continuing mission to provide you with exceptional heart care, we have created designated Provider Care Teams.  These Care Teams include your primary Cardiologist (physician) and Advanced Practice Providers (APPs -  Physician Assistants and Nurse Practitioners) who all work together to provide you with the care you need, when you need it.  We recommend signing up for the patient portal called "MyChart".  Sign up information is provided on this After Visit Summary.  MyChart is used to connect with patients for Virtual Visits (Telemedicine).  Patients are able to view lab/test results, encounter notes, upcoming appointments, etc.  Non-urgent messages can be sent to your provider as well.   To learn more about what you can do with MyChart, go to [email or web address].    Your next appointment:   6 week(s)  The format for your next appointment:   In Person  Provider:   Gypsy Balsam, MD   Other Instructions   Cardiac Nuclear Scan A cardiac nuclear scan is a test that measures blood flow to the heart when a person is resting and when he  or she is exercising. The test looks for problems such as:  Not enough blood reaching a portion of the heart.  The heart muscle not working normally. You may need this test if:  You have heart disease.  You have had abnormal lab results.  You have had heart surgery or a balloon procedure to open up blocked arteries (angioplasty).  You have chest pain.  You have shortness of breath. In this test, a radioactive dye (tracer) is injected into your bloodstream. After the tracer has traveled to your heart, an imaging device is used to measure how much of the tracer is absorbed by or distributed to various areas of your heart. This procedure is usually done at a hospital and takes 2-4 hours. Tell a health care provider about:  Any allergies you have.  All medicines you are taking, including vitamins, herbs, eye drops, creams, and over-the-counter medicines.  Any problems you or family members have had with anesthetic medicines.  Any blood disorders you have.  Any surgeries you have had.  Any medical conditions you have.  Whether you are pregnant or may be pregnant. What are the risks? Generally, this is a safe procedure. However, problems may occur, including:  Serious chest pain and heart attack. This is only a risk if the stress portion of the test is done.  Rapid heartbeat.  Sensation of warmth in your chest. This usually passes quickly.  Allergic reaction to the tracer. What happens before the procedure?  Ask your health care provider about  changing or stopping your regular medicines. This is especially important if you are taking diabetes medicines or blood thinners.  Follow instructions from your health care provider about eating or drinking restrictions.  Remove your jewelry on the day of the procedure. What happens during the procedure?  An IV will be inserted into one of your veins.  Your health care provider will inject a small amount of radioactive tracer  through the IV.  You will wait for 20-40 minutes while the tracer travels through your bloodstream.  Your heart activity will be monitored with an electrocardiogram (ECG).  You will lie down on an exam table.  Images of your heart will be taken for about 15-20 minutes.  You may also have a stress test. For this test, one of the following may be done: ? You will exercise on a treadmill or stationary bike. While you exercise, your heart's activity will be monitored with an ECG, and your blood pressure will be checked. ? You will be given medicines that will increase blood flow to parts of your heart. This is done if you are unable to exercise.  When blood flow to your heart has peaked, a tracer will again be injected through the IV.  After 20-40 minutes, you will get back on the exam table and have more images taken of your heart.  Depending on the type of tracer used, scans may need to be repeated 3-4 hours later.  Your IV line will be removed when the procedure is over. The procedure may vary among health care providers and hospitals. What happens after the procedure?  Unless your health care provider tells you otherwise, you may return to your normal schedule, including diet, activities, and medicines.  Unless your health care provider tells you otherwise, you may increase your fluid intake. This will help to flush the contrast dye from your body. Drink enough fluid to keep your urine pale yellow.  Ask your health care provider, or the department that is doing the test: ? When will my results be ready? ? How will I get my results? Summary  A cardiac nuclear scan measures the blood flow to the heart when a person is resting and when he or she is exercising.  Tell your health care provider if you are pregnant.  Before the procedure, ask your health care provider about changing or stopping your regular medicines. This is especially important if you are taking diabetes medicines or  blood thinners.  After the procedure, unless your health care provider tells you otherwise, increase your fluid intake. This will help flush the contrast dye from your body.  After the procedure, unless your health care provider tells you otherwise, you may return to your normal schedule, including diet, activities, and medicines. This information is not intended to replace advice given to you by your health care provider. Make sure you discuss any questions you have with your health care provider. Document Revised: 10/25/2017 Document Reviewed: 10/25/2017 Elsevier Patient Education  2020 [employer].   Aspirin and Your Heart  Aspirin is a medicine that prevents the cells in the blood that are used for clotting, called platelets, from sticking together. Aspirin can be used to help reduce the risk of blood clots, heart attacks, and other heart-related problems. Can I take aspirin? Your health care provider will help you determine whether it is safe and beneficial for you to take aspirin daily. Taking aspirin daily may be helpful if you:  Have had a heart attack  or chest pain.  Are at risk for a heart attack.  Have undergone open-heart surgery, such as coronary artery bypass surgery (CABG).  Have had coronary angioplasty or a stent.  Have had certain types of stroke or transient ischemic attack (TIA).  Have peripheral artery disease (PAD).  Have chronic heart rhythm problems such as atrial fibrillation and cannot take an anticoagulant.  Have valve disease or have had surgery on a valve. What are the risks? Daily use of aspirin can cause side effects. Some of these include:  Bleeding. Bleeding problems can be minor or serious. An example of a minor problem is a cut that does not stop bleeding. An example of a more serious problem is stomach bleeding or, rarely, bleeding into the brain. Your risk of bleeding is increased if you are also taking non-steroidal anti-inflammatory drugs  (NSAIDs).  Increased bruising.  Upset stomach.  An allergic reaction. People who have nasal polyps have an increased risk of developing an aspirin allergy. General guidelines  Take aspirin only as told by your health care provider. Make sure that you understand how much you should take and what form you should take. The two forms of aspirin are: ? Non-enteric-coated.This type of aspirin does not have a coating and is absorbed quickly. This type of aspirin also comes in a chewable form. ? Enteric-coated. This type of aspirin has a coating that releases the medicine very slowly. Enteric-coated aspirin might cause less stomach upset than non-enteric-coated aspirin. This type of aspirin should not be chewed or crushed.  Limit alcohol intake to no more than 1 drink a day for nonpregnant women and 2 drinks a day for men. Drinking alcohol increases your risk of bleeding. One drink equals 12 oz of beer, 5 oz of wine, or 1 oz of hard liquor. Contact a health care provider if you:  Have unusual bleeding or bruising.  Have stomach pain or nausea.  Have ringing in your ears.  Have an allergic reaction that causes: ? Hives. ? Itchy skin. ? Swelling of the lips, tongue, or face. Get help right away if you:  Notice that your bowel movements are bloody, dark red, or black in color.  Vomit or cough up blood.  Have blood in your urine.  Cough, have noisy breathing (wheeze), or feel short of breath.  Have chest pain, especially if the pain spreads to the arms, back, neck, or jaw.  Have a severe headache, or a headache with confusion, or dizziness. These symptoms may represent a serious problem that is an emergency. Do not wait to see if the symptoms will go away. Get medical help right away. Call your local emergency services (911 in the [place] Do not drive yourself to the hospital. Summary  Aspirin can be used to help reduce the risk of blood clots, heart attacks, and other heart-related  problems.  Daily use of aspirin can increase your risk of side effects. Your health care provider will help you determine whether it is safe and beneficial for you to take aspirin daily.  Take aspirin only as told by your health care provider. Make sure that you understand how much you can take and what form you can take. This information is not intended to replace advice given to you by your health care provider. Make sure you discuss any questions you have with your health care provider. Document Revised: 03/11/2017 Document Reviewed: 03/11/2017 Elsevier Patient Education  2020 [employer].

## 2019-08-02 NOTE — Progress Notes (Signed)
Cardiology Consultation:    Date:  08/02/2019   ID:  Crystal Wu, DOB Dec 03, 1970, MRN [id number]  PCP:  Gordan Payment., MD  Cardiologist:  Gypsy Balsam, MD   Referring MD: Gordan Payment., MD   Chief Complaint  Patient presents with  . Coronary Artery Disease    History of Present Illness:    Crystal Wu is a 49 y.o. female who is being seen today for the evaluation of coronary artery disease at the request of Gordan Payment., MD.  Recently she had COVID-19 infection.  She was admitted to the hospital CT of her chest was done and showed calcification of the aorta as well as coronary arteries.  Her past medical history significant for diabetes mellitus for about 10 years, essential hypertension, dyslipidemia.  She does not have any cardiac symptoms.  Described to have some exertional shortness of breath but no chest pain, no tightness, no pressure, no burning no squeezing in the chest. She does not exercise on the regular basis but there was a time she lost significant amount of weight about 60 pounds.  Recently she gained some weight back but she is convinced that she need to go back to exercises and continue working on her weight loss. Smoke only few cigarettes in her life Does have family history of coronary disease but not premature Past Medical History:  Diagnosis Date  . Coronary artery calcification   . Diabetes (HCC)   . H/O blood clots    right posterior calf  . Hypertension     Past Surgical History:  Procedure Laterality Date  . CESAREAN SECTION     X2  . CHOLECYSTECTOMY    . UMBILICAL HERNIA REPAIR      Current Medications: Current Meds  Medication Sig  . fenofibrate 160 MG tablet Take 160 mg by mouth daily.  Marland Kitchen glipiZIDE-metformin (METAGLIP) 5-500 MG tablet Take 1 tablet by mouth in the morning, at noon, and at bedtime.   . insulin detemir (LEVEMIR FLEXTOUCH) 100 UNIT/ML FlexPen Levemir FlexTouch U-100 Insulin 100 unit/mL (3 mL) subcutaneous pen  .  levothyroxine (SYNTHROID) 50 MCG tablet Take 50 mcg by mouth daily.  Marland Kitchen lisinopril-hydrochlorothiazide (ZESTORETIC) 20-25 MG tablet Take 1 tablet by mouth daily.     Allergies:   Codeine   Social History   Socioeconomic History  . Marital status: Married    Spouse name: Not on file  . Number of children: Not on file  . Years of education: Not on file  . Highest education level: Not on file  Occupational History  . Not on file  Tobacco Use  . Smoking status: Never Smoker  . Smokeless tobacco: Never Used  Substance and Sexual Activity  . Alcohol use: Not Currently    Comment: occasional  . Drug use: Never  . Sexual activity: Not on file  Other Topics Concern  . Not on file  Social History Narrative  . Not on file   Social Determinants of Health   Financial Resource Strain:   . Difficulty of Paying Living Expenses: Not on file  Food Insecurity:   . Worried About Programme researcher, broadcasting/film/video in the Last Year: Not on file  . Ran Out of Food in the Last Year: Not on file  Transportation Needs:   . Lack of Transportation (Medical): Not on file  . Lack of Transportation (Non-Medical): Not on file  Physical Activity:   . Days of Exercise per Week: Not on file  .  Minutes of Exercise per Session: Not on file  Stress:   . Feeling of Stress : Not on file  Social Connections:   . Frequency of Communication with Friends and Family: Not on file  . Frequency of Social Gatherings with Friends and Family: Not on file  . Attends Religious Services: Not on file  . Active Member of Clubs or Organizations: Not on file  . Attends Banker Meetings: Not on file  . Marital Status: Not on file     Family History: The patient's family history includes COPD in her half-sister; Diabetes in her father, half-sister, and paternal grandmother; Hypercholesterolemia in her father; Hypertension in her father and paternal grandmother; Irritable bowel syndrome in her half-sister; Stroke in her  half-sister and paternal grandmother. ROS:   Please see the history of present illness.    All 14 point review of systems negative except as described per history of present illness.  EKGs/Labs/Other Studies Reviewed:    The following studies were reviewed today:   EKG:  EKG is  ordered today.  The ekg ordered today demonstrates normal sinus rhythm, normal P interval, normal QS complex duration morphology, nonspecific ST segment changes  Recent Labs: No results found for requested labs within last 8760 hours.  Recent Lipid Panel No results found for: CHOL, TRIG, HDL, CHOLHDL, VLDL, LDLCALC, LDLDIRECT  Physical Exam:    VS:  BP 112/86 (BP Location: Left Arm, Patient Position: Sitting, Cuff Size: Normal)   Pulse 72   Ht 5\' 8"  (1.727 m)   Wt 235 lb (106.6 kg)   SpO2 99%   BMI 35.73 kg/m     Wt Readings from Last 3 Encounters:  08/02/19 235 lb (106.6 kg)  09/22/16 218 lb (98.9 kg)  08/25/16 218 lb (98.9 kg)     GEN:  Well nourished, well developed in no acute distress HEENT: Normal NECK: No JVD; No carotid bruits LYMPHATICS: No lymphadenopathy CARDIAC: RRR, no murmurs, no rubs, no gallops RESPIRATORY:  Clear to auscultation without rales, wheezing or rhonchi  ABDOMEN: Soft, non-tender, non-distended MUSCULOSKELETAL:  No edema; No deformity  SKIN: Warm and dry NEUROLOGIC:  Alert and oriented x 3 PSYCHIATRIC:  Normal affect   ASSESSMENT:    1. Chest pain of uncertain etiology   2. Coronary artery disease, angina presence unspecified, unspecified vessel or lesion type, unspecified whether native or transplanted heart   3. Coronary artery disease involving native coronary artery of native heart without angina pectoris   4. Type 2 diabetes mellitus without complication, with long-term current use of insulin (HCC)   5. Essential hypertension   6. Dyslipidemia    PLAN:    In order of problems listed above:  1. Chest pain she, she denies having any typical chest pain but  she does have calcification of the coronary arteries on CT which make a diagnosis of coronary artery disease.  What make me what is the fact that she does have longstanding diabetes initially very poorly controlled she may have coronary disease which is obstructive without the classical symptoms.  Therefore, I think the best approach for this situation will be to start baby aspirin every single day, I will schedule her to have Lexiscan. 2. Type 2 diabetes this is followed by antimedicine team.  Her diabetes was poorly controlled recently because of COVID-19 infection however she is getting back to herself to normal things should get better especially since she is not convinced that she need to exercise on a regular basis  and I strongly encouraged her to do that. 3. Essential hypertension appears to be controlled today. 4. Dyslipidemia, will call primary care physician to get fasting lipid profile.  She does have a problem with triglycerides which is fairly typical for poorly controlled diabetes.  She is taking fenofibrate.  I think in the future we may be able to stop that medication if she continue working out as well as lose some weight with good diet habits.  This conversation will continue.   Medication Adjustments/Labs and Tests Ordered: Current medicines are reviewed at length with the patient today.  Concerns regarding medicines are outlined above.  Orders Placed This Encounter  Procedures  . MYOCARDIAL PERFUSION IMAGING  . EKG 12-Lead   Meds ordered this encounter  Medications  . aspirin EC 81 MG tablet    Sig: Take 1 tablet (81 mg total) by mouth daily.    Dispense:  90 tablet    Refill:  0    Signed, Park Liter, MD, [facility]. 08/02/2019 2:01 PM    [facility]

## 2019-08-23 ENCOUNTER — Telehealth: Payer: Self-pay | Admitting: *Deleted

## 2019-08-23 NOTE — Telephone Encounter (Signed)
Patient given detailed instructions per Myocardial Perfusion Study Information Sheet for the test on 08/29/2019 at 1130. Patient notified to arrive 15 minutes early and that it is imperative to arrive on time for appointment to keep from having the test rescheduled.  If you need to cancel or reschedule your appointment, please call the office within 24 hours of your appointment. . Patient verbalized understanding.Tao Satz, Adelene Idler No mychart available

## 2019-08-29 ENCOUNTER — Ambulatory Visit (INDEPENDENT_AMBULATORY_CARE_PROVIDER_SITE_OTHER): Payer: BC Managed Care – PPO

## 2019-08-29 ENCOUNTER — Other Ambulatory Visit: Payer: Self-pay

## 2019-08-29 VITALS — Ht 68.0 in | Wt 235.0 lb

## 2019-08-29 DIAGNOSIS — I2510 Atherosclerotic heart disease of native coronary artery without angina pectoris: Secondary | ICD-10-CM

## 2019-08-29 DIAGNOSIS — R079 Chest pain, unspecified: Secondary | ICD-10-CM | POA: Diagnosis not present

## 2019-08-29 MED ORDER — TECHNETIUM TC 99M TETROFOSMIN IV KIT
32.7000 | PACK | Freq: Once | INTRAVENOUS | Status: AC | PRN
  Administered 2019-08-29: 32.7 via INTRAVENOUS

## 2019-08-29 MED ORDER — REGADENOSON 0.4 MG/5ML IV SOLN
0.4000 mg | Freq: Once | INTRAVENOUS | Status: AC
  Administered 2019-08-29: 0.4 mg via INTRAVENOUS

## 2019-08-30 ENCOUNTER — Ambulatory Visit: Payer: BC Managed Care – PPO

## 2019-08-30 LAB — MYOCARDIAL PERFUSION IMAGING
LV dias vol: 77 mL (ref 46–106)
LV sys vol: 24 mL
Peak HR: 99 {beats}/min
Rest HR: 68 {beats}/min
SDS: 3
SRS: 9
SSS: 12
TID: 0.8

## 2019-08-30 MED ORDER — TECHNETIUM TC 99M TETROFOSMIN IV KIT
31.8000 | PACK | Freq: Once | INTRAVENOUS | Status: AC | PRN
  Administered 2019-08-30: 31.8 via INTRAVENOUS

## 2019-09-01 ENCOUNTER — Telehealth: Payer: Self-pay

## 2019-09-01 NOTE — Telephone Encounter (Signed)
-----   Message from Georgeanna Lea, MD sent at 08/31/2019  9:54 PM EDT ----- Stress test negative

## 2019-09-01 NOTE — Telephone Encounter (Signed)
Spoke with patient regarding results.  Patient verbalizes understanding and is agreeable to plan of care. Advised patient to call back with any issues or concerns.  

## 2019-09-06 ENCOUNTER — Encounter: Payer: Self-pay | Admitting: Cardiology

## 2019-09-06 ENCOUNTER — Ambulatory Visit (INDEPENDENT_AMBULATORY_CARE_PROVIDER_SITE_OTHER): Payer: BC Managed Care – PPO | Admitting: Cardiology

## 2019-09-06 ENCOUNTER — Other Ambulatory Visit: Payer: Self-pay

## 2019-09-06 VITALS — BP 110/66 | HR 78 | Ht 68.0 in | Wt 235.0 lb

## 2019-09-06 DIAGNOSIS — I10 Essential (primary) hypertension: Secondary | ICD-10-CM

## 2019-09-06 DIAGNOSIS — I2510 Atherosclerotic heart disease of native coronary artery without angina pectoris: Secondary | ICD-10-CM

## 2019-09-06 DIAGNOSIS — E119 Type 2 diabetes mellitus without complications: Secondary | ICD-10-CM

## 2019-09-06 DIAGNOSIS — E785 Hyperlipidemia, unspecified: Secondary | ICD-10-CM | POA: Diagnosis not present

## 2019-09-06 DIAGNOSIS — Z794 Long term (current) use of insulin: Secondary | ICD-10-CM

## 2019-09-06 MED ORDER — ATORVASTATIN CALCIUM 20 MG PO TABS: 20.0000 mg | ORAL_TABLET | Freq: Every day | ORAL | 1 refills | Status: DC

## 2019-09-06 NOTE — Patient Instructions (Signed)
Medication Instructions:  Your physician has recommended you make the following change in your medication:   START: Lipitor 20 mg daily   *If you need a refill on your cardiac medications before your next appointment, please call your pharmacy*   Lab Work: None. If you have labs (blood work) drawn today and your tests are completely normal, you will receive your results only by: Marland Kitchen MyChart Message (if you have MyChart) OR . A paper copy in the mail If you have any lab test that is abnormal or we need to change your treatment, we will call you to review the results.   Testing/Procedures: None.     Follow-Up: At [facility], you and your health needs are our priority.  As part of our continuing mission to provide you with exceptional heart care, we have created designated Provider Care Teams.  These Care Teams include your primary Cardiologist (physician) and Advanced Practice Providers (APPs -  Physician Assistants and Nurse Practitioners) who all work together to provide you with the care you need, when you need it.  We recommend signing up for the patient portal called "MyChart".  Sign up information is provided on this After Visit Summary.  MyChart is used to connect with patients for Virtual Visits (Telemedicine).  Patients are able to view lab/test results, encounter notes, upcoming appointments, etc.  Non-urgent messages can be sent to your provider as well.   To learn more about what you can do with MyChart, go to [email or web address].    Your next appointment:   5 month(s)  The format for your next appointment:   In Person  Provider:   Gypsy Balsam, MD   Other Instructions  Atorvastatin tablets What is this medicine? ATORVASTATIN (a TORE va sta tin) is known as a HMG-CoA reductase inhibitor or 'statin'. It lowers the level of cholesterol and triglycerides in the blood. This drug may also reduce the risk of heart attack, stroke, or other health problems in  patients with risk factors for heart disease. Diet and lifestyle changes are often used with this drug. This medicine may be used for other purposes; ask your health care provider or pharmacist if you have questions. COMMON BRAND NAME(S): Lipitor What should I tell my health care provider before I take this medicine? They need to know if you have any of these conditions:  diabetes  if you often drink alcohol  history of stroke  kidney disease  liver disease  muscle aches or weakness  thyroid disease  an unusual or allergic reaction to atorvastatin, other medicines, foods, dyes, or preservatives  pregnant or trying to get pregnant  breast-feeding How should I use this medicine? Take this medicine by mouth with a glass of water. Follow the directions on the prescription label. You can take it with or without food. If it upsets your stomach, take it with food. Do not take with grapefruit juice. Take your medicine at regular intervals. Do not take it more often than directed. Do not stop taking except on your doctor's advice. Talk to your pediatrician regarding the use of this medicine in children. While this drug may be prescribed for children as young as 10 for selected conditions, precautions do apply. Overdosage: If you think you have taken too much of this medicine contact a poison control center or emergency room at once. NOTE: This medicine is only for you. Do not share this medicine with others. What if I miss a dose? If you miss a dose, take it  as soon as you can. If your next dose is to be taken in less than 12 hours, then do not take the missed dose. Take the next dose at your regular time. Do not take double or extra doses. What may interact with this medicine? Do not take this medicine with any of the following medications:  dasabuvir; ombitasvir; paritaprevir; ritonavir  ombitasvir; paritaprevir; ritonavir  posaconazole  red yeast rice This medicine may also  interact with the following medications:  alcohol  birth control pills  certain antibiotics like erythromycin and clarithromycin  certain antivirals for HIV or hepatitis  certain medicines for cholesterol like fenofibrate, gemfibrozil, and niacin  certain medicines for fungal infections like ketoconazole and itraconazole  colchicine  cyclosporine  digoxin  grapefruit juice  rifampin This list may not describe all possible interactions. Give your health care provider a list of all the medicines, herbs, non-prescription drugs, or dietary supplements you use. Also tell them if you smoke, drink alcohol, or use illegal drugs. Some items may interact with your medicine. What should I watch for while using this medicine? Visit your doctor or health care professional for regular check-ups. You may need regular tests to make sure your liver is working properly. Your health care professional may tell you to stop taking this medicine if you develop muscle problems. If your muscle problems do not go away after stopping this medicine, contact your health care professional. Do not become pregnant while taking this medicine. Women should inform their health care professional if they wish to become pregnant or think they might be pregnant. There is a potential for serious side effects to an unborn child. Talk to your health care professional or pharmacist for more information. Do not breast-feed an infant while taking this medicine. This medicine may increase blood sugar. Ask your healthcare provider if changes in diet or medicines are needed if you have diabetes. If you are going to need surgery or other procedure, tell your doctor that you are using this medicine. This drug is only part of a total heart-health program. Your doctor or a dietician can suggest a low-cholesterol and low-fat diet to help. Avoid alcohol and smoking, and keep a proper exercise schedule. This medicine may cause a decrease  in Co-Enzyme Q-10. You should make sure that you get enough Co-Enzyme Q-10 while you are taking this medicine. Discuss the foods you eat and the vitamins you take with your health care professional. What side effects may I notice from receiving this medicine? Side effects that you should report to your doctor or health care professional as soon as possible:  allergic reactions like skin rash, itching or hives, swelling of the face, lips, or tongue  fever  joint pain  loss of memory  redness, blistering, peeling or loosening of the skin, including inside the mouth  signs and symptoms of high blood sugar such as being more thirsty or hungry or having to urinate more than normal. You may also feel very tired or have blurry vision.  signs and symptoms of liver injury like dark yellow or brown urine; general ill feeling or flu-like symptoms; light-belly pain; unusually weak or tired; yellowing of the eyes or skin  signs and symptoms of muscle injury like dark urine; trouble passing urine or change in the amount of urine; unusually weak or tired; muscle pain or side or back pain Side effects that usually do not require medical attention (report to your doctor or health care professional if they continue or  are bothersome):  diarrhea  nausea  stomach pain  trouble sleeping  upset stomach This list may not describe all possible side effects. Call your doctor for medical advice about side effects. You may report side effects to FDA at 1-800-FDA-1088. Where should I keep my medicine? Keep out of the reach of children. Store between 20 and 25 degrees C (68 and 77 degrees F). Throw away any unused medicine after the expiration date. NOTE: This sheet is a summary. It may not cover all possible information. If you have questions about this medicine, talk to your doctor, pharmacist, or health care provider.  2020 Elsevier/Gold Standard (2018-03-02 11:36:16)

## 2019-09-06 NOTE — Progress Notes (Signed)
Cardiology Office Note:    Date:  09/06/2019   ID:  Crystal Wu, DOB [birth year], MRN [id number]  PCP:  Raina Mina., MD  Cardiologist:  Jenne Campus, MD    Referring MD: Raina Mina., MD   No chief complaint on file. Doing well  History of Present Illness:    Crystal Wu is a 49 y.o. female with past medical history significant for calcification of the coronary arteries noted on CT, she did have a stress test done which showed no evidence of significant stenosis.  She also has diabetes, essential hypertension.  She lost few more pounds and I congratulated her for it.  She also started exercising on a regular basis which I congratulated her as well.  Denies have any chest pain tightness squeezing pressure burning chest.  She said she is back to herself after COVID-19 infection.  Past Medical History:  Diagnosis Date  . Coronary artery calcification   . Diabetes ([place])   . H/O blood clots    right posterior calf  . Hypertension     Past Surgical History:  Procedure Laterality Date  . CESAREAN SECTION     X2  . CHOLECYSTECTOMY    . UMBILICAL HERNIA REPAIR      Current Medications: Current Meds  Medication Sig  . aspirin EC 81 MG tablet Take 1 tablet (81 mg total) by mouth daily.  . fenofibrate 160 MG tablet Take 160 mg by mouth daily.  Marland Kitchen glipiZIDE-metformin (METAGLIP) 5-500 MG tablet Take 1 tablet by mouth in the morning, at noon, and at bedtime.   . insulin detemir (LEVEMIR FLEXTOUCH) 100 UNIT/ML FlexPen Levemir FlexTouch U-100 Insulin 100 unit/mL (3 mL) subcutaneous pen  . levothyroxine (SYNTHROID) 50 MCG tablet Take 50 mcg by mouth daily.  Marland Kitchen lisinopril-hydrochlorothiazide (ZESTORETIC) 20-25 MG tablet Take 1 tablet by mouth daily.     Allergies:   Codeine   Social History   Socioeconomic History  . Marital status: Married    Spouse name: Not on file  . Number of children: Not on file  . Years of education: Not on file  . Highest education level: Not on  file  Occupational History  . Not on file  Tobacco Use  . Smoking status: Never Smoker  . Smokeless tobacco: Never Used  Substance and Sexual Activity  . Alcohol use: Not Currently    Comment: occasional  . Drug use: Never  . Sexual activity: Not on file  Other Topics Concern  . Not on file  Social History Narrative  . Not on file   Social Determinants of Health   Financial Resource Strain:   . Difficulty of Paying Living Expenses:   Food Insecurity:   . Worried About Charity fundraiser in the Last Year:   . Arboriculturist in the Last Year:   Transportation Needs:   . Film/video editor (Medical):   Marland Kitchen Lack of Transportation (Non-Medical):   Physical Activity:   . Days of Exercise per Week:   . Minutes of Exercise per Session:   Stress:   . Feeling of Stress :   Social Connections:   . Frequency of Communication with Friends and Family:   . Frequency of Social Gatherings with Friends and Family:   . Attends Religious Services:   . Active Member of Clubs or Organizations:   . Attends Archivist Meetings:   Marland Kitchen Marital Status:      Family History: The patient's family  history includes COPD in her half-sister; Diabetes in her father, half-sister, and paternal grandmother; Hypercholesterolemia in her father; Hypertension in her father and paternal grandmother; Irritable bowel syndrome in her half-sister; Stroke in her half-sister and paternal grandmother. ROS:   Please see the history of present illness.    All 14 point review of systems negative except as described per history of present illness  EKGs/Labs/Other Studies Reviewed:      Recent Labs: No results found for requested labs within last 8760 hours.  Recent Lipid Panel No results found for: CHOL, TRIG, HDL, CHOLHDL, VLDL, LDLCALC, LDLDIRECT  Physical Exam:    VS:  BP 110/66   Pulse 78   Ht 5\' 8"  (1.727 m)   Wt 235 lb (106.6 kg)   SpO2 98%   BMI 35.73 kg/m     Wt Readings from Last 3  Encounters:  09/06/19 235 lb (106.6 kg)  08/29/19 235 lb (106.6 kg)  08/02/19 235 lb (106.6 kg)     GEN:  Well nourished, well developed in no acute distress HEENT: Normal NECK: No JVD; No carotid bruits LYMPHATICS: No lymphadenopathy CARDIAC: RRR, no murmurs, no rubs, no gallops RESPIRATORY:  Clear to auscultation without rales, wheezing or rhonchi  ABDOMEN: Soft, non-tender, non-distended MUSCULOSKELETAL:  No edema; No deformity  SKIN: Warm and dry LOWER EXTREMITIES: no swelling NEUROLOGIC:  Alert and oriented x 3 PSYCHIATRIC:  Normal affect   ASSESSMENT:    1. Coronary artery disease involving native coronary artery of native heart without angina pectoris   2. Essential hypertension   3. Type 2 diabetes mellitus without complication, with long-term current use of insulin (HCC)   4. Dyslipidemia    PLAN:    In order of problems listed above:  1. Coronary disease as proven by CT showing calcification.  Luckily, stress test was negative. 2. Essential hypertension blood pressure seems to be well controlled continue present management. 3. Type 2 diabetes.  Followed by internal medicine team.  She is started exercising on a regular basis also watching her diet more carefully all this should help.  She need to be on a statin.  Either moderate or high intensity statin.  She does not have risk enhancing factor, therefore moderate to statin will be sufficient.  I will put her on 20 mg of Lipitor. 4. Dyslipidemia: Discussion as above.   Medication Adjustments/Labs and Tests Ordered: Current medicines are reviewed at length with the patient today.  Concerns regarding medicines are outlined above.  No orders of the defined types were placed in this encounter.  Medication changes: No orders of the defined types were placed in this encounter.   Signed, 10/02/19, MD, [facility] 09/06/2019 1:59 PM    [facility]

## 2019-12-21 DIAGNOSIS — I700 Atherosclerosis of aorta: Secondary | ICD-10-CM

## 2019-12-21 HISTORY — DX: Atherosclerosis of other arteries: I70.0

## 2019-12-25 DIAGNOSIS — Z021 Encounter for pre-employment examination: Secondary | ICD-10-CM

## 2019-12-25 HISTORY — DX: Encounter for pre-employment examination: Z02.1

## 2020-03-19 ENCOUNTER — Other Ambulatory Visit: Payer: Self-pay | Admitting: Cardiology

## 2020-03-29 ENCOUNTER — Other Ambulatory Visit: Payer: Self-pay

## 2020-03-29 DIAGNOSIS — E119 Type 2 diabetes mellitus without complications: Secondary | ICD-10-CM | POA: Insufficient documentation

## 2020-03-29 DIAGNOSIS — I10 Essential (primary) hypertension: Secondary | ICD-10-CM | POA: Insufficient documentation

## 2020-04-01 ENCOUNTER — Ambulatory Visit (INDEPENDENT_AMBULATORY_CARE_PROVIDER_SITE_OTHER): Payer: BC Managed Care – PPO | Admitting: Cardiology

## 2020-04-01 ENCOUNTER — Other Ambulatory Visit: Payer: Self-pay

## 2020-04-01 ENCOUNTER — Encounter: Payer: Self-pay | Admitting: Cardiology

## 2020-04-01 VITALS — BP 120/82 | HR 72 | Ht 68.0 in | Wt 214.0 lb

## 2020-04-01 DIAGNOSIS — Z8616 Personal history of COVID-19: Secondary | ICD-10-CM

## 2020-04-01 DIAGNOSIS — I10 Essential (primary) hypertension: Secondary | ICD-10-CM

## 2020-04-01 DIAGNOSIS — E119 Type 2 diabetes mellitus without complications: Secondary | ICD-10-CM

## 2020-04-01 DIAGNOSIS — I2510 Atherosclerotic heart disease of native coronary artery without angina pectoris: Secondary | ICD-10-CM

## 2020-04-01 DIAGNOSIS — Z794 Long term (current) use of insulin: Secondary | ICD-10-CM

## 2020-04-01 NOTE — Progress Notes (Signed)
Cardiology Office Note:    Date:  04/01/2020   ID:  Crystal Wu, DOB 08/03/1970, MRN [id number]  PCP:  Gordan Payment., MD  Cardiologist:  Gypsy Balsam, MD    Referring MD: Gordan Payment., MD   Chief Complaint  Patient presents with  . Follow-up  I am doing fine  History of Present Illness:    Crystal Wu is a 49 y.o. female with past medical history significant for calcification of the coronary artery noted on CT, stress test after that was negative.  She does have multiple risk factors for coronary artery disease, namely hypertension diabetes dyslipidemia.  She comes today 2 months of follow-up she lost significant amount of weight with diet proper management of her diabetes as well as exercises she works a lot and that is the reason why she lost some weight.  She is very proud of herself and she should be.  Denies have any cardiac complaints, there is no chest pain tightness squeezing pressure burning chest no shortness of breath.  Past Medical History:  Diagnosis Date  . Acquired hypothyroidism 06/06/2018  . Atherosclerosis of aortic bifurcation and common iliac arteries (HCC) 12/21/2019  . Coronary artery calcification   . Coronary artery disease in form of calcification of coronary arteries is seen on CT 08/02/2019  . Diabetes (HCC)   . Diverticulosis of intestine without bleeding 05/15/2016  . Dyslipidemia 08/02/2019  . Essential hypertension 07/19/2017  . H/O blood clots    right posterior calf  . Heart murmur 05/06/2017  . High risk medication use 10/31/2015  . History of COVID-19 07/03/2019  . History of DVT (deep vein thrombosis) 05/26/2016   right posterior calf Formatting of this note might be different from the original. Right lower extremity with no clot on repeat of her [place]  . Hypertension   . Malaise and fatigue 10/31/2015   Last Assessment & Plan:  Formatting of this note might be different from the original. Relevant Hx: Course: Daily Update: Today's Plan:this is off  and on for her and depending on how her sugars are and her activity level some days are better than others for her and will follow this for her  Electronically signed by: Krystal Clark, NP 11/05/15 1253  . Mixed hyperlipidemia 10/31/2015   Last Assessment & Plan:  Formatting of this note might be different from the original. Relevant Hx: Course: Daily Update: Today's Plan:update her labs for her today with this and see where she is at   Electronically signed by: Krystal Clark, NP 11/05/15 1253  . Obesity (BMI 30-39.9) 10/31/2015   Last Assessment & Plan:  Formatting of this note might be different from the original. Relevant Hx: Course: Daily Update: Today's Plan:she is to continue with her work on her diet and she had gained some of her weight back she had lost and have tried to encourage her to really make effort with her diet to work on this  Electronically signed by: Krystal Clark, NP 11/05/15 1253  . Posterior tibial tendinitis, right leg 08/25/2016  . Pre-employment examination 12/25/2019  . Recurrent UTI 07/05/2018  . Type 2 diabetes mellitus with stage 3a chronic kidney disease, with long-term current use of insulin (HCC) 10/31/2015   Last Assessment & Plan:  Formatting of this note might be different from the original. Relevant Hx: Course: Daily Update: Today's Plan:she has been working on her diet as best she can she starts back to work next week doing 10  hour days and she is not looking forward to it but she is very busy when she is at work which is good for her. She is going to have her labs drawn today and will see where s  . Type 2 diabetes mellitus without complication, with long-term current use of insulin (HCC) 08/02/2019  . Uncontrolled secondary diabetes mellitus with stage 3 CKD (GFR 30-59) (HCC) 06/06/2018    Past Surgical History:  Procedure Laterality Date  . CESAREAN SECTION     X2  . CHOLECYSTECTOMY    . UMBILICAL HERNIA REPAIR      Current  Medications: Current Meds  Medication Sig  . aspirin EC 81 MG tablet Take 1 tablet (81 mg total) by mouth daily.  Marland Kitchen atorvastatin (LIPITOR) 20 MG tablet Take 1 tablet by mouth once daily  . glipiZIDE (GLUCOTROL) 5 MG tablet Take 5 mg by mouth 2 (two) times daily.  Marland Kitchen glipiZIDE-metformin (METAGLIP) 5-500 MG tablet Take 1 tablet by mouth 2 (two) times daily before a meal.   . insulin detemir (LEVEMIR FLEXTOUCH) 100 UNIT/ML FlexPen Levemir FlexTouch U-100 Insulin 100 unit/mL (3 mL) subcutaneous pen  . levothyroxine (SYNTHROID) 50 MCG tablet Take 50 mcg by mouth daily.  Marland Kitchen lisinopril (ZESTRIL) 20 MG tablet Take 20 mg by mouth daily.  Marland Kitchen lisinopril-hydrochlorothiazide (ZESTORETIC) 20-25 MG tablet Take 1 tablet by mouth daily.  . metFORMIN (GLUCOPHAGE) 500 MG tablet Take 500 mg by mouth 3 (three) times daily.  . Semaglutide,0.25 or 0.5MG /DOS, (OZEMPIC, 0.25 OR 0.5 MG/DOSE,) 2 MG/1.5ML SOPN Inject 0.5 mg into the skin once a week.  . [DISCONTINUED] Semaglutide,0.25 or 0.5MG /DOS, 2 MG/1.5ML SOPN Inject 0.4 mg into the skin once a week.     Allergies:   Codeine   Social History   Socioeconomic History  . Marital status: Married    Spouse name: Not on file  . Number of children: Not on file  . Years of education: Not on file  . Highest education level: Not on file  Occupational History  . Not on file  Tobacco Use  . Smoking status: Never Smoker  . Smokeless tobacco: Never Used  Substance and Sexual Activity  . Alcohol use: Not Currently    Comment: occasional  . Drug use: Never  . Sexual activity: Not on file  Other Topics Concern  . Not on file  Social History Narrative  . Not on file   Social Determinants of Health   Financial Resource Strain:   . Difficulty of Paying Living Expenses: Not on file  Food Insecurity:   . Worried About Programme researcher, broadcasting/film/video in the Last Year: Not on file  . Ran Out of Food in the Last Year: Not on file  Transportation Needs:   . Lack of Transportation  (Medical): Not on file  . Lack of Transportation (Non-Medical): Not on file  Physical Activity:   . Days of Exercise per Week: Not on file  . Minutes of Exercise per Session: Not on file  Stress:   . Feeling of Stress : Not on file  Social Connections:   . Frequency of Communication with Friends and Family: Not on file  . Frequency of Social Gatherings with Friends and Family: Not on file  . Attends Religious Services: Not on file  . Active Member of Clubs or Organizations: Not on file  . Attends Banker Meetings: Not on file  . Marital Status: Not on file     Family History: The patient's family  history includes COPD in her half-sister; Diabetes in her father, half-sister, and paternal grandmother; Hypercholesterolemia in her father; Hypertension in her father and paternal grandmother; Irritable bowel syndrome in her half-sister; Stroke in her half-sister and paternal grandmother. ROS:   Please see the history of present illness.    All 14 point review of systems negative except as described per history of present illness  EKGs/Labs/Other Studies Reviewed:      Recent Labs: No results found for requested labs within last 8760 hours.  Recent Lipid Panel No results found for: CHOL, TRIG, HDL, CHOLHDL, VLDL, LDLCALC, LDLDIRECT  Physical Exam:    VS:  BP 120/82 (BP Location: Left Arm, Patient Position: Sitting, Cuff Size: Normal)   Pulse 72   Ht 5\' 8"  (1.727 m)   Wt 214 lb (97.1 kg)   SpO2 99%   BMI 32.54 kg/m     Wt Readings from Last 3 Encounters:  04/01/20 214 lb (97.1 kg)  09/06/19 235 lb (106.6 kg)  08/29/19 235 lb (106.6 kg)     GEN:  Well nourished, well developed in no acute distress HEENT: Normal NECK: No JVD; No carotid bruits LYMPHATICS: No lymphadenopathy CARDIAC: RRR, no murmurs, no rubs, no gallops RESPIRATORY:  Clear to auscultation without rales, wheezing or rhonchi  ABDOMEN: Soft, non-tender, non-distended MUSCULOSKELETAL:  No edema; No  deformity  SKIN: Warm and dry LOWER EXTREMITIES: no swelling NEUROLOGIC:  Alert and oriented x 3 PSYCHIATRIC:  Normal affect   ASSESSMENT:    1. Coronary artery disease involving native coronary artery of native heart without angina pectoris   2. Primary hypertension   3. Type 2 diabetes mellitus without complication, with long-term current use of insulin (HCC)   4. History of COVID-19    PLAN:    In order of problems listed above:  1. Coronary disease doing well from that point review asymptomatic the key is risk factors modifications. 2. Essential hypertension blood pressure perfectly controlled we will continue present management. 3. Type 2 diabetes followed by internal medicine team.  Her hemoglobin A1c from 3 months ago was 6.8, I congratulated her for it and encouraged her to continue doing what she is doing. 4. Dyslipidemia she is young diabetic I put her on moderate intensity statin, her last fasting lipid profile from 3 months ago done by primary care physician.I reviewed show LDL 54 HDL 38 however triglycerides were slightly elevated 168.  Now with her weight loss as well as proper management of diabetes some sugar is much better.  Overall she is doing very well and congratulated achieving significant goals in her health management.   Medication Adjustments/Labs and Tests Ordered: Current medicines are reviewed at length with the patient today.  Concerns regarding medicines are outlined above.  No orders of the defined types were placed in this encounter.  Medication changes: No orders of the defined types were placed in this encounter.   Signed, 10/29/19, MD, [facility] 04/01/2020 4:39 PM    [facility]

## 2020-04-01 NOTE — Patient Instructions (Signed)

## 2020-06-23 ENCOUNTER — Other Ambulatory Visit: Payer: Self-pay | Admitting: Cardiology

## 2020-06-24 NOTE — Telephone Encounter (Signed)
Rx refill sent to pharmacy. 

## 2020-09-23 DIAGNOSIS — N760 Acute vaginitis: Secondary | ICD-10-CM | POA: Insufficient documentation

## 2020-09-23 DIAGNOSIS — B9689 Other specified bacterial agents as the cause of diseases classified elsewhere: Secondary | ICD-10-CM | POA: Insufficient documentation

## 2020-09-30 ENCOUNTER — Other Ambulatory Visit: Payer: Self-pay | Admitting: Cardiology

## 2020-09-30 NOTE — Telephone Encounter (Signed)
Refill sent to pharmacy.   

## 2020-11-22 ENCOUNTER — Ambulatory Visit: Payer: BC Managed Care – PPO | Admitting: Cardiology

## 2021-01-01 ENCOUNTER — Ambulatory Visit: Payer: BC Managed Care – PPO | Admitting: Cardiology

## 2021-01-01 ENCOUNTER — Other Ambulatory Visit: Payer: Self-pay

## 2021-01-01 VITALS — BP 106/62 | HR 81 | Ht 68.0 in | Wt 183.0 lb

## 2021-01-01 DIAGNOSIS — I2584 Coronary atherosclerosis due to calcified coronary lesion: Secondary | ICD-10-CM | POA: Diagnosis not present

## 2021-01-01 DIAGNOSIS — E782 Mixed hyperlipidemia: Secondary | ICD-10-CM | POA: Diagnosis not present

## 2021-01-01 DIAGNOSIS — I10 Essential (primary) hypertension: Secondary | ICD-10-CM

## 2021-01-01 DIAGNOSIS — I2510 Atherosclerotic heart disease of native coronary artery without angina pectoris: Secondary | ICD-10-CM | POA: Diagnosis not present

## 2021-01-01 NOTE — Patient Instructions (Signed)

## 2021-01-01 NOTE — Progress Notes (Signed)
Cardiology Office Note:    Date:  01/01/2021   ID:  PHYLISHA DIX, DOB October 24, 1970, MRN [id number]  PCP:  Gordan Payment., MD  Cardiologist:  Gypsy Balsam, MD    Referring MD: Gordan Payment., MD   Chief Complaint  Patient presents with   Follow-up    History of Present Illness:    Crystal Wu is a 50 y.o. female initially she was referred to [place] because of multiple risk factors for coronary artery disease on top of that she did have CT of the chest done which showed calcification of the coronary artery.  After that we perform stress test which showed no evidence of ischemia.  She did do a lot of changes in her lifestyle.  She lost significant amount of weight.  She feels great.  Denies have any chest pain tightness squeezing pressure burning chest no palpitations no dizziness no swelling of lower extremities.  Past Medical History:  Diagnosis Date   Acquired hypothyroidism 06/06/2018   Atherosclerosis of aortic bifurcation and common iliac arteries (HCC) 12/21/2019   Coronary artery calcification    Coronary artery disease in form of calcification of coronary arteries is seen on CT 08/02/2019   Diabetes (HCC)    Diverticulosis of intestine without bleeding 05/15/2016   Dyslipidemia 08/02/2019   Essential hypertension 07/19/2017   H/O blood clots    right posterior calf   Heart murmur 05/06/2017   High risk medication use 10/31/2015   History of COVID-19 07/03/2019   History of DVT (deep vein thrombosis) 05/26/2016   right posterior calf Formatting of this note might be different from the original. Right lower extremity with no clot on repeat of her [place]   Hypertension    Malaise and fatigue 10/31/2015   Last Assessment & Plan:  Formatting of this note might be different from the original. Relevant Hx: Course: Daily Update: Today's Plan:this is off and on for her and depending on how her sugars are and her activity level some days are better than others for her and will follow this for her   Electronically signed by: Krystal Clark, NP 11/05/15 1253   Mixed hyperlipidemia 10/31/2015   Last Assessment & Plan:  Formatting of this note might be different from the original. Relevant Hx: Course: Daily Update: Today's Plan:update her labs for her today with this and see where she is at   Electronically signed by: Krystal Clark, NP 11/05/15 1253   Obesity (BMI 30-39.9) 10/31/2015   Last Assessment & Plan:  Formatting of this note might be different from the original. Relevant Hx: Course: Daily Update: Today's Plan:she is to continue with her work on her diet and she had gained some of her weight back she had lost and have tried to encourage her to really make effort with her diet to work on this  Electronically signed by: Krystal Clark, NP 11/05/15 1253   Posterior tibial tendinitis, right leg 08/25/2016   Pre-employment examination 12/25/2019   Recurrent UTI 07/05/2018   Type 2 diabetes mellitus with stage 3a chronic kidney disease, with long-term current use of insulin (HCC) 10/31/2015   Last Assessment & Plan:  Formatting of this note might be different from the original. Relevant Hx: Course: Daily Update: Today's Plan:she has been working on her diet as best she can she starts back to work next week doing 10 hour days and she is not looking forward to it but she is very busy when she is at  work which is good for her. She is going to have her labs drawn today and will see where s   Type 2 diabetes mellitus without complication, with long-term current use of insulin (HCC) 08/02/2019   Uncontrolled secondary diabetes mellitus with stage 3 CKD (GFR 30-59) (HCC) 06/06/2018    Past Surgical History:  Procedure Laterality Date   CESAREAN SECTION     X2   CHOLECYSTECTOMY     UMBILICAL HERNIA REPAIR      Current Medications: Current Meds  Medication Sig   aspirin EC 81 MG tablet Take 1 tablet (81 mg total) by mouth daily.   atorvastatin (LIPITOR) 20 MG tablet Take  1 tablet by mouth once daily (Patient taking differently: Take 20 mg by mouth daily.)   levothyroxine (SYNTHROID) 50 MCG tablet Take 50 mcg by mouth daily.   lisinopril (ZESTRIL) 20 MG tablet Take 20 mg by mouth daily.   metFORMIN (GLUCOPHAGE) 500 MG tablet Take 500 mg by mouth 2 (two) times daily with a meal.   Semaglutide,0.25 or 0.5MG /DOS, (OZEMPIC, 0.25 OR 0.5 MG/DOSE,) 2 MG/1.5ML SOPN Inject 2 mg into the skin once a week.     Allergies:   Codeine   Social History   Socioeconomic History   Marital status: Married    Spouse name: Not on file   Number of children: Not on file   Years of education: Not on file   Highest education level: Not on file  Occupational History   Not on file  Tobacco Use   Smoking status: Never   Smokeless tobacco: Never  Substance and Sexual Activity   Alcohol use: Not Currently    Comment: occasional   Drug use: Never   Sexual activity: Not on file  Other Topics Concern   Not on file  Social History Narrative   Not on file   Social Determinants of Health   Financial Resource Strain: Not on file  Food Insecurity: Not on file  Transportation Needs: Not on file  Physical Activity: Not on file  Stress: Not on file  Social Connections: Not on file     Family History: The patient's family history includes COPD in her half-sister; Diabetes in her father, half-sister, and paternal grandmother; Hypercholesterolemia in her father; Hypertension in her father and paternal grandmother; Irritable bowel syndrome in her half-sister; Stroke in her half-sister and paternal grandmother. ROS:   Please see the history of present illness.    All 14 point review of systems negative except as described per history of present illness  EKGs/Labs/Other Studies Reviewed:      Recent Labs: No results found for requested labs within last 8760 hours.  Recent Lipid Panel No results found for: CHOL, TRIG, HDL, CHOLHDL, VLDL, LDLCALC, LDLDIRECT  Physical Exam:     VS:  BP 106/62 (BP Location: Right Arm, Patient Position: Sitting)   Pulse 81   Ht 5\' 8"  (1.727 m)   Wt 183 lb (83 kg)   SpO2 98%   BMI 27.83 kg/m     Wt Readings from Last 3 Encounters:  01/01/21 183 lb (83 kg)  04/01/20 214 lb (97.1 kg)  09/06/19 235 lb (106.6 kg)     GEN:  Well nourished, well developed in no acute distress HEENT: Normal NECK: No JVD; No carotid bruits LYMPHATICS: No lymphadenopathy CARDIAC: RRR, no murmurs, no rubs, no gallops RESPIRATORY:  Clear to auscultation without rales, wheezing or rhonchi  ABDOMEN: Soft, non-tender, non-distended MUSCULOSKELETAL:  No edema; No deformity  SKIN: Warm and dry LOWER EXTREMITIES: no swelling NEUROLOGIC:  Alert and oriented x 3 PSYCHIATRIC:  Normal affect   ASSESSMENT:    1. Coronary artery disease involving native coronary artery of native heart without angina pectoris   2. Coronary artery calcification   3. Essential hypertension   4. Mixed hyperlipidemia    PLAN:    In order of problems listed above:  : Artery disease as proven by calcification of the coronary arteries.  She is doing well asymptomatic stress test negative on antiplatelet therapy which I will continue.  She is also on statin Dyslipidemia I did review her cholesterol done by primary care physician which showed LDL of 62 and HDL 40.  Good cholesterol profile we will continue present management. Diabetes hemoglobin A1c done 4 months ago was 6.0 it is a good number.  I encouraged her to be active sticking with good diet and ask her to let me know if she develop any cardiac complaints otherwise I will see her back in 1 year   Medication Adjustments/Labs and Tests Ordered: Current medicines are reviewed at length with the patient today.  Concerns regarding medicines are outlined above.  No orders of the defined types were placed in this encounter.  Medication changes: No orders of the defined types were placed in this  encounter.   Signed, Georgeanna Lea, MD, [facility] 01/01/2021 3:43 PM    [facility]

## 2021-10-01 ENCOUNTER — Other Ambulatory Visit: Payer: Self-pay | Admitting: Cardiology

## 2021-10-01 NOTE — Telephone Encounter (Signed)
Rx refill sent to pharmacy. 

## 2021-11-03 ENCOUNTER — Other Ambulatory Visit: Payer: Self-pay | Admitting: Cardiology

## 2021-11-04 NOTE — Telephone Encounter (Signed)
Atorvastatin 20 mg # 90  with message for patient needs appointment for future refills , patient has recall for Aug 2023.  [id number] - [place], [place] - [address]

## 2022-02-04 ENCOUNTER — Other Ambulatory Visit: Payer: Self-pay | Admitting: Cardiology

## 2022-02-13 ENCOUNTER — Ambulatory Visit: Payer: BC Managed Care – PPO | Admitting: Cardiology

## 2022-02-26 ENCOUNTER — Other Ambulatory Visit: Payer: Self-pay | Admitting: Cardiology

## 2022-02-26 DIAGNOSIS — N926 Irregular menstruation, unspecified: Secondary | ICD-10-CM | POA: Insufficient documentation

## 2022-02-26 HISTORY — DX: Irregular menstruation, unspecified: N92.6

## 2022-04-03 ENCOUNTER — Encounter: Payer: Self-pay | Admitting: Cardiology

## 2022-04-03 ENCOUNTER — Ambulatory Visit: Payer: BC Managed Care – PPO | Attending: Cardiology | Admitting: Cardiology

## 2022-04-03 VITALS — BP 110/78 | HR 75 | Ht 68.0 in | Wt 168.0 lb

## 2022-04-03 DIAGNOSIS — I2510 Atherosclerotic heart disease of native coronary artery without angina pectoris: Secondary | ICD-10-CM | POA: Diagnosis not present

## 2022-04-03 DIAGNOSIS — E119 Type 2 diabetes mellitus without complications: Secondary | ICD-10-CM

## 2022-04-03 DIAGNOSIS — E669 Obesity, unspecified: Secondary | ICD-10-CM

## 2022-04-03 DIAGNOSIS — I10 Essential (primary) hypertension: Secondary | ICD-10-CM

## 2022-04-03 DIAGNOSIS — Z794 Long term (current) use of insulin: Secondary | ICD-10-CM

## 2022-04-03 DIAGNOSIS — I2584 Coronary atherosclerosis due to calcified coronary lesion: Secondary | ICD-10-CM

## 2022-04-03 NOTE — Patient Instructions (Signed)
Medication Instructions:  Your physician recommends that you continue on your current medications as directed. Please refer to the Current Medication list given to you today.  *If you need a refill on your cardiac medications before your next appointment, please call your pharmacy*   Lab Work: NONE If you have labs (blood work) drawn today and your tests are completely normal, you will receive your results only by: MyChart Message (if you have MyChart) OR A paper copy in the mail If you have any lab test that is abnormal or we need to change your treatment, we will call you to review the results.   Testing/Procedures: NONE   Follow-Up: At  [facility], you and your health needs are our priority.  As part of our continuing mission to provide you with exceptional heart care, we have created designated Provider Care Teams.  These Care Teams include your primary Cardiologist (physician) and Advanced Practice Providers (APPs -  Physician Assistants and Nurse Practitioners) who all work together to provide you with the care you need, when you need it.  We recommend signing up for the patient portal called "MyChart".  Sign up information is provided on this After Visit Summary.  MyChart is used to connect with patients for Virtual Visits (Telemedicine).  Patients are able to view lab/test results, encounter notes, upcoming appointments, etc.  Non-urgent messages can be sent to your provider as well.   To learn more about what you can do with MyChart, go to [email or web address].    Your next appointment:   1 year(s)  The format for your next appointment:   In Person  Provider:   Robert Krasowski, MD    Other Instructions   Important Information About Sugar       

## 2022-04-03 NOTE — Progress Notes (Unsigned)
Cardiology Office Note:    Date:  04/03/2022   ID:  Crystal Wu, DOB 07-21-1970, MRN [id number]  PCP:  Raina Mina., MD  Cardiologist:  Jenne Campus, MD    Referring MD: Raina Mina., MD   Chief Complaint  Patient presents with   Annual Exam    History of Present Illness:    Crystal Wu is a 51 y.o. female with past history significant for coronary calcification, after that she had a stress test which showed no evidence of ischemia, diabetes, essential hypertension, dyslipidemia.  She made tremendous changes in her life change her diet she has been put on Ozempic, she lost more than 80 pounds diabetes is much better everything looks good she is also trying to be a little more active feeling good denies having any chest pain tightness squeezing pressure burning chest  Past Medical History:  Diagnosis Date   Acquired hypothyroidism 06/06/2018   Atherosclerosis of aortic bifurcation and common iliac arteries ([place]) 12/21/2019   BMI 26.0-26.9,adult 10/31/2015   Last Assessment & Plan: Formatting of this note might be different from the original. Relevant Hx: Course: Daily Update: Today's Plan:she is to continue with her work on her diet and she had gained some of her weight back she had lost and have tried to encourage her to really make effort with her diet to work on this Electronically signed by: Mayer Camel, NP 11/05/15 1253   Coronary artery calcification    Coronary artery disease in form of calcification of coronary arteries is seen on CT 08/02/2019   Diabetes ([place])    Diverticulosis of intestine without bleeding 05/15/2016   Dyslipidemia 08/02/2019   Essential hypertension 07/19/2017   H/O blood clots    right posterior calf   Heart murmur 05/06/2017   High risk medication use 10/31/2015   History of COVID-19 07/03/2019   History of DVT (deep vein thrombosis) 05/26/2016   right posterior calf Formatting of this note might be different from the  original. Right lower extremity with no clot on repeat of her [place]   Hypertension    Malaise and fatigue 10/31/2015   Last Assessment & Plan:  Formatting of this note might be different from the original. Relevant Hx: Course: Daily Update: Today's Plan:this is off and on for her and depending on how her sugars are and her activity level some days are better than others for her and will follow this for her  Electronically signed by: Mayer Camel, NP 11/05/15 1253   Menstrual changes 02/26/2022   Mixed hyperlipidemia 10/31/2015   Last Assessment & Plan:  Formatting of this note might be different from the original. Relevant Hx: Course: Daily Update: Today's Plan:update her labs for her today with this and see where she is at   Electronically signed by: Mayer Camel, NP 11/05/15 1253   Obesity (BMI 30-39.9) 10/31/2015   Last Assessment & Plan:  Formatting of this note might be different from the original. Relevant Hx: Course: Daily Update: Today's Plan:she is to continue with her work on her diet and she had gained some of her weight back she had lost and have tried to encourage her to really make effort with her diet to work on this  Electronically signed by: Mayer Camel, NP 11/05/15 1253   Posterior tibial tendinitis, right leg 08/25/2016   Pre-employment examination 12/25/2019   Recurrent UTI 07/05/2018   Type 2 diabetes mellitus with stage 3a chronic kidney disease, with long-term  current use of insulin ([place]) 10/31/2015   Last Assessment & Plan:  Formatting of this note might be different from the original. Relevant Hx: Course: Daily Update: Today's Plan:she has been working on her diet as best she can she starts back to work next week doing 10 hour days and she is not looking forward to it but she is very busy when she is at work which is good for her. She is going to have her labs drawn today and will see where s   Type 2 diabetes mellitus without  complication, with long-term current use of insulin ([place]) 08/02/2019   Uncontrolled secondary diabetes mellitus with stage 3 CKD (GFR 30-59) 06/06/2018    Past Surgical History:  Procedure Laterality Date   CESAREAN SECTION     X2   CHOLECYSTECTOMY     UMBILICAL HERNIA REPAIR      Current Medications: Current Meds  Medication Sig   aspirin EC 81 MG tablet Take 1 tablet (81 mg total) by mouth daily.   atorvastatin (LIPITOR) 20 MG tablet Take 1 tablet (20 mg total) by mouth daily. Patient must keep 04/03/22 appointment for further refills. 3 rd/final attempt   Cyanocobalamin (B-12 COMPLIANCE INJECTION) 1000 MCG/ML KIT Inject 1,000 mcg as directed every 30 (thirty) days.   levothyroxine (SYNTHROID) 50 MCG tablet Take 50 mcg by mouth daily.   lisinopril (ZESTRIL) 20 MG tablet Take 20 mg by mouth daily.   metFORMIN (GLUCOPHAGE) 500 MG tablet Take 500 mg by mouth 2 (two) times daily with a meal.   Semaglutide,0.25 or 0.5MG/DOS, (OZEMPIC, 0.25 OR 0.5 MG/DOSE,) 2 MG/1.5ML SOPN Inject 2 mg into the skin once a week.   VITAMIN D, CHOLECALCIFEROL, PO Take 3,500 mg by mouth daily.     Allergies:   Codeine   Social History   Socioeconomic History   Marital status: Married    Spouse name: Not on file   Number of children: Not on file   Years of education: Not on file   Highest education level: Not on file  Occupational History   Not on file  Tobacco Use   Smoking status: Never   Smokeless tobacco: Never  Substance and Sexual Activity   Alcohol use: Not Currently    Comment: occasional   Drug use: Never   Sexual activity: Not on file  Other Topics Concern   Not on file  Social History Narrative   Not on file   Social Determinants of Health   Financial Resource Strain: Not on file  Food Insecurity: Not on file  Transportation Needs: Not on file  Physical Activity: Not on file  Stress: Not on file  Social Connections: Not on file     Family History: The patient's family  history includes COPD in her half-sister; Diabetes in her father, half-sister, and paternal grandmother; Hypercholesterolemia in her father; Hypertension in her father and paternal grandmother; Irritable bowel syndrome in her half-sister; Stroke in her half-sister and paternal grandmother. ROS:   Please see the history of present illness.    All 14 point review of systems negative except as described per history of present illness  EKGs/Labs/Other Studies Reviewed:      Recent Labs: No results found for requested labs within last 365 days.  Recent Lipid Panel No results found for: "CHOL", "TRIG", "HDL", "CHOLHDL", "VLDL", "LDLCALC", "LDLDIRECT"  Physical Exam:    VS:  BP 110/78 (BP Location: Left Arm, Patient Position: Sitting, Cuff Size: Normal)   Pulse 75  Ht _0  (1.727 m)   Wt 168 lb (76.2 kg)   SpO2 97%   BMI 25.54 kg/m     Wt Readings from Last 3 Encounters:  04/03/22 168 lb (76.2 kg)  01/01/21 183 lb (83 kg)  04/01/20 214 lb (97.1 kg)     GEN:  Well nourished, well developed in no acute distress HEENT: Normal NECK: No JVD; No carotid bruits LYMPHATICS: No lymphadenopathy CARDIAC: RRR, no murmurs, no rubs, no gallops RESPIRATORY:  Clear to auscultation without rales, wheezing or rhonchi  ABDOMEN: Soft, non-tender, non-distended MUSCULOSKELETAL:  No edema; No deformity  SKIN: Warm and dry LOWER EXTREMITIES: no swelling NEUROLOGIC:  Alert and oriented x 3 PSYCHIATRIC:  Normal affect   ASSESSMENT:    1. Coronary artery calcification   2. Essential hypertension   3. Type 2 diabetes mellitus without complication, with long-term current use of insulin (HCC)   4. Obesity (BMI 30-39.9)    PLAN:    In order of problems listed above:  Coronary calcification.  Risk factors modifications, continue antiplatelets therapy. Diabetes much to control weight now with significant loss of weight with Ozempic Dyslipidemia I did review her K PN which only her LDL of 48 she is  on Lipitor 20 which I will continue. Overall I congratulated Katherene for significant lifestyle changes that give her a big benefits.  I encouraged her to continue doing that.  I asked her however to be a little more aggressive with her exercises which should help with HDL which is slightly on the lower side for 7   Medication Adjustments/Labs and Tests Ordered: Current medicines are reviewed at length with the patient today.  Concerns regarding medicines are outlined above.  No orders of the defined types were placed in this encounter.  Medication changes: No orders of the defined types were placed in this encounter.   Signed, Park Liter, MD, [facility] 04/03/2022 11:43 AM    [place]

## 2022-04-10 ENCOUNTER — Other Ambulatory Visit: Payer: Self-pay | Admitting: Cardiology

## 2022-11-11 DIAGNOSIS — R232 Flushing: Secondary | ICD-10-CM | POA: Insufficient documentation

## 2023-04-11 ENCOUNTER — Other Ambulatory Visit: Payer: Self-pay | Admitting: Cardiology

## 2023-04-16 ENCOUNTER — Encounter: Payer: Self-pay | Admitting: Cardiology

## 2023-04-16 ENCOUNTER — Ambulatory Visit: Payer: BC Managed Care – PPO | Attending: Cardiology | Admitting: Cardiology

## 2023-04-16 VITALS — BP 132/74 | HR 75 | Ht 68.0 in | Wt 160.0 lb

## 2023-04-16 DIAGNOSIS — Z794 Long term (current) use of insulin: Secondary | ICD-10-CM

## 2023-04-16 DIAGNOSIS — E1122 Type 2 diabetes mellitus with diabetic chronic kidney disease: Secondary | ICD-10-CM

## 2023-04-16 DIAGNOSIS — I2510 Atherosclerotic heart disease of native coronary artery without angina pectoris: Secondary | ICD-10-CM

## 2023-04-16 DIAGNOSIS — E782 Mixed hyperlipidemia: Secondary | ICD-10-CM | POA: Diagnosis not present

## 2023-04-16 DIAGNOSIS — M79604 Pain in right leg: Secondary | ICD-10-CM

## 2023-04-16 DIAGNOSIS — I10 Essential (primary) hypertension: Secondary | ICD-10-CM

## 2023-04-16 DIAGNOSIS — N1831 Chronic kidney disease, stage 3a: Secondary | ICD-10-CM

## 2023-04-16 NOTE — Progress Notes (Signed)
Cardiology Office Note:    Date:  04/16/2023   ID:  Crystal Wu, DOB 1970/07/14, MRN [id number]  PCP:  Gordan Payment., MD  Cardiologist:  Gypsy Balsam, MD    Referring MD: Gordan Payment., MD   Chief Complaint  Patient presents with   Follow-up    History of Present Illness:    Crystal Wu is a 52 y.o. female past medical history significant for calcification of the coronary arteries, after that stress test was negative, essential hypertension, diabetes, dyslipidemia, history of DVT in the right lower extremities. Comes today to months for follow-up chief complaint is of pain in the right lower extremities she does have varicosities over there that are clearly visible and she said those are getting somewhat tender.  She is concerned about potentially having blood clot in the leg.  Otherwise she is doing fine.  She maintained her weight and doing very well  Past Medical History:  Diagnosis Date   Acquired hypothyroidism 06/06/2018   Atherosclerosis of aortic bifurcation and common iliac arteries (HCC) 12/21/2019   BMI 26.0-26.9,adult 10/31/2015   Last Assessment & Plan: Formatting of this note might be different from the original. Relevant Hx: Course: Daily Update: Today's Plan:she is to continue with her work on her diet and she had gained some of her weight back she had lost and have tried to encourage her to really make effort with her diet to work on this Electronically signed by: Krystal Clark, NP 11/05/15 1253   Coronary artery calcification    Coronary artery disease in form of calcification of coronary arteries is seen on CT 08/02/2019   Diabetes (HCC)    Diverticulosis of intestine without bleeding 05/15/2016   Dyslipidemia 08/02/2019   Essential hypertension 07/19/2017   H/O blood clots    right posterior calf   Heart murmur 05/06/2017   High risk medication use 10/31/2015   History of COVID-19 07/03/2019   History of DVT (deep vein thrombosis)  05/26/2016   right posterior calf Formatting of this note might be different from the original. Right lower extremity with no clot on repeat of her [place]   Hypertension    Malaise and fatigue 10/31/2015   Last Assessment & Plan:  Formatting of this note might be different from the original. Relevant Hx: Course: Daily Update: Today's Plan:this is off and on for her and depending on how her sugars are and her activity level some days are better than others for her and will follow this for her  Electronically signed by: Krystal Clark, NP 11/05/15 1253   Menstrual changes 02/26/2022   Mixed hyperlipidemia 10/31/2015   Last Assessment & Plan:  Formatting of this note might be different from the original. Relevant Hx: Course: Daily Update: Today's Plan:update her labs for her today with this and see where she is at   Electronically signed by: Krystal Clark, NP 11/05/15 1253   Obesity (BMI 30-39.9) 10/31/2015   Last Assessment & Plan:  Formatting of this note might be different from the original. Relevant Hx: Course: Daily Update: Today's Plan:she is to continue with her work on her diet and she had gained some of her weight back she had lost and have tried to encourage her to really make effort with her diet to work on this  Electronically signed by: Krystal Clark, NP 11/05/15 1253   Posterior tibial tendinitis, right leg 08/25/2016   Pre-employment examination 12/25/2019   Recurrent UTI 07/05/2018  Type 2 diabetes mellitus with stage 3a chronic kidney disease, with long-term current use of insulin (HCC) 10/31/2015   Last Assessment & Plan:  Formatting of this note might be different from the original. Relevant Hx: Course: Daily Update: Today's Plan:she has been working on her diet as best she can she starts back to work next week doing 10 hour days and she is not looking forward to it but she is very busy when she is at work which is good for her. She is going to have  her labs drawn today and will see where s   Type 2 diabetes mellitus without complication, with long-term current use of insulin (HCC) 08/02/2019   Uncontrolled secondary diabetes mellitus with stage 3 CKD (GFR 30-59) 06/06/2018    Past Surgical History:  Procedure Laterality Date   CESAREAN SECTION     X2   CHOLECYSTECTOMY     UMBILICAL HERNIA REPAIR      Current Medications: Current Meds  Medication Sig   aspirin EC 81 MG tablet Take 1 tablet (81 mg total) by mouth daily.   atorvastatin (LIPITOR) 20 MG tablet Take 1 tablet (20 mg total) by mouth daily. 1rst attempt, patient needs and appt for additional refills   Cyanocobalamin (B-12 COMPLIANCE INJECTION) 1000 MCG/ML KIT Inject 1,000 mcg as directed every 30 (thirty) days.   GABAPENTIN PO Take 1 tablet by mouth as needed (hot flashes).   levothyroxine (SYNTHROID) 50 MCG tablet Take 50 mcg by mouth daily.   lisinopril (ZESTRIL) 20 MG tablet Take 20 mg by mouth daily.   metFORMIN (GLUCOPHAGE) 500 MG tablet Take 500 mg by mouth 2 (two) times daily with a meal.   Semaglutide,0.25 or 0.5MG /DOS, (OZEMPIC, 0.25 OR 0.5 MG/DOSE,) 2 MG/1.5ML SOPN Inject 2 mg into the skin once a week.   VITAMIN D, CHOLECALCIFEROL, PO Take 3,500 mg by mouth daily.     Allergies:   Ciprofloxacin, Codeine, and Nitrofurantoin   Social History   Socioeconomic History   Marital status: Married    Spouse name: Not on file   Number of children: Not on file   Years of education: Not on file   Highest education level: Not on file  Occupational History   Not on file  Tobacco Use   Smoking status: Never   Smokeless tobacco: Never  Substance and Sexual Activity   Alcohol use: Not Currently    Comment: occasional   Drug use: Never   Sexual activity: Not on file  Other Topics Concern   Not on file  Social History Narrative   Not on file   Social Determinants of Health   Financial Resource Strain: Not on file  Food Insecurity: Low Risk  (12/11/2022)    Received from [facility]   Hunger Vital Sign    Worried About Running Out of Food in the Last Year: Never true    Ran Out of Food in the Last Year: Never true  Transportation Needs: No Transportation Needs (12/11/2022)   Received from [facility]    In the past 12 months, has lack of reliable transportation kept you from medical appointments, meetings, work or from getting things needed for daily living? : No  Physical Activity: Not on file  Stress: Not on file  Social Connections: Not on file     Family History: The patient's family history includes COPD in her half-sister; Diabetes in her father, half-sister, and paternal grandmother; Hypercholesterolemia in her father; Hypertension in her father  and paternal grandmother; Irritable bowel syndrome in her half-sister; Stroke in her half-sister and paternal grandmother. ROS:   Please see the history of present illness.    All 14 point review of systems negative except as described per history of present illness  EKGs/Labs/Other Studies Reviewed:         Recent Labs: No results found for requested labs within last 365 days.  Recent Lipid Panel No results found for: "CHOL", "TRIG", "HDL", "CHOLHDL", "VLDL", "LDLCALC", "LDLDIRECT"  Physical Exam:    VS:  BP 132/74 (BP Location: Left Arm, Patient Position: Sitting)   Pulse 75   Ht 5\' 8"  (1.727 m)   Wt 160 lb (72.6 kg)   SpO2 100%   BMI 24.33 kg/m     Wt Readings from Last 3 Encounters:  04/16/23 160 lb (72.6 kg)  04/03/22 168 lb (76.2 kg)  01/01/21 183 lb (83 kg)     GEN:  Well nourished, well developed in no acute distress HEENT: Normal NECK: No JVD; No carotid bruits LYMPHATICS: No lymphadenopathy CARDIAC: RRR, no murmurs, no rubs, no gallops RESPIRATORY:  Clear to auscultation without rales, wheezing or rhonchi  ABDOMEN: Soft, non-tender, non-distended MUSCULOSKELETAL:  No edema; No deformity  SKIN: Warm and dry LOWER EXTREMITIES: no  swelling NEUROLOGIC:  Alert and oriented x 3 PSYCHIATRIC:  Normal affect   ASSESSMENT:    1. Essential hypertension   2. Coronary artery disease involving native coronary artery of native heart without angina pectoris   3. Type 2 diabetes mellitus with stage 3a chronic kidney disease, with long-term current use of insulin (HCC)   4. Mixed hyperlipidemia    PLAN:    In order of problems listed above:  History of DVT with some swelling and pain in the right lower extremities.  I suspect this is related to varicosities I do not dissipate this to be acute DVT however I will do DVT study to look for any evidence of chronic problem. Coronary disease stable denies have any signs symptoms that would indicate reactivation of the problem, continue risk factors modifications. Diabetes I did review blood test done 3 weeks ago by primary care physician hemoglobin A1c 5.9 acceptable. Dyslipidemia on statin in form of Lipitor 20, I did review blood test done by primary care physician LDL 34 HDL 55 doing excellent continue present management   Medication Adjustments/Labs and Tests Ordered: Current medicines are reviewed at length with the patient today.  Concerns regarding medicines are outlined above.  Orders Placed This Encounter  Procedures   EKG 12-Lead   Medication changes: No orders of the defined types were placed in this encounter.   Signed, Georgeanna Lea, MD, [facility] 04/16/2023 1:34 PM    [facility]

## 2023-04-16 NOTE — Patient Instructions (Addendum)
Medication Instructions:  Your physician recommends that you continue on your current medications as directed. Please refer to the Current Medication list given to you today.  *If you need a refill on your cardiac medications before your next appointment, please call your pharmacy*   Lab Work: None Ordered If you have labs (blood work) drawn today and your tests are completely normal, you will receive your results only by: MyChart Message (if you have MyChart) OR A paper copy in the mail If you have any lab test that is abnormal or we need to change your treatment, we will call you to review the results.   Testing/Procedures: Your physician has requested that you have a lower extremity arterial duplex. This test is an ultrasound of the arteries in the legs . It looks at arterial blood flow in the legs Allow one hour for Lower Arterial scans. There are no restrictions or special instructions.  Please note: We ask at that you not bring children with you during ultrasound (echo/ vascular) testing. Due to room size and safety concerns, children are not allowed in the ultrasound rooms during exams. Our front office staff cannot provide observation of children in our lobby area while testing is being conducted. An adult accompanying a patient to their appointment will only be allowed in the ultrasound room at the discretion of the ultrasound technician under special circumstances. We apologize for any inconvenience.   Please note: We ask at that you not bring children with you during ultrasound (echo/ vascular) testing. Due to room size and safety concerns, children are not allowed in the ultrasound rooms during exams. Our front office staff cannot provide observation of children in our lobby area while testing is being conducted. An adult accompanying a patient to their appointment will only be allowed in the ultrasound room at the discretion of the ultrasound technician under special circumstances. We  apologize for any inconvenience.    Follow-Up: At [facility], you and your health needs are our priority.  As part of our continuing mission to provide you with exceptional heart care, we have created designated Provider Care Teams.  These Care Teams include your primary Cardiologist (physician) and Advanced Practice Providers (APPs -  Physician Assistants and Nurse Practitioners) who all work together to provide you with the care you need, when you need it.  We recommend signing up for the patient portal called "MyChart".  Sign up information is provided on this After Visit Summary.  MyChart is used to connect with patients for Virtual Visits (Telemedicine).  Patients are able to view lab/test results, encounter notes, upcoming appointments, etc.  Non-urgent messages can be sent to your provider as well.   To learn more about what you can do with MyChart, go to [email or web address].    Your next appointment:   12 month(s)  The format for your next appointment:   In Person  Provider:   Gypsy Balsam, MD    Other Instructions NA

## 2023-04-16 NOTE — Addendum Note (Signed)
Addended by: Baldo Ash D on: 04/16/2023 02:01 PM   Modules accepted: Orders

## 2023-05-11 ENCOUNTER — Ambulatory Visit: Payer: BC Managed Care – PPO | Attending: Cardiology

## 2023-05-11 DIAGNOSIS — M79604 Pain in right leg: Secondary | ICD-10-CM | POA: Diagnosis not present

## 2023-05-17 ENCOUNTER — Telehealth: Payer: Self-pay

## 2023-05-17 NOTE — Telephone Encounter (Signed)
LVM per DPR- per Dr. Vanetta Shawl note regarding normal Venous study results. Encouraged to call with any questions. Routed to PCP.

## 2023-07-02 ENCOUNTER — Other Ambulatory Visit: Payer: Self-pay | Admitting: Cardiology

## 2024-04-12 ENCOUNTER — Other Ambulatory Visit: Payer: Self-pay | Admitting: Cardiology
